# Patient Record
Sex: Female | Born: 1975 | Race: White | Hispanic: No | Marital: Married | Smoking: Former smoker
Health system: Southern US, Community
[De-identification: ages and names within clinical notes are randomized; demographics above are authoritative.]

## PROBLEM LIST (undated history)

## (undated) DIAGNOSIS — IMO0001 Reserved for inherently not codable concepts without codable children: Secondary | ICD-10-CM

## (undated) DIAGNOSIS — K5900 Constipation, unspecified: Secondary | ICD-10-CM

## (undated) DIAGNOSIS — N83209 Unspecified ovarian cyst, unspecified side: Secondary | ICD-10-CM

## (undated) DIAGNOSIS — G43909 Migraine, unspecified, not intractable, without status migrainosus: Secondary | ICD-10-CM

## (undated) DIAGNOSIS — F329 Major depressive disorder, single episode, unspecified: Secondary | ICD-10-CM

## (undated) DIAGNOSIS — R52 Pain, unspecified: Secondary | ICD-10-CM

## (undated) DIAGNOSIS — F32A Depression, unspecified: Secondary | ICD-10-CM

## (undated) DIAGNOSIS — G4700 Insomnia, unspecified: Secondary | ICD-10-CM

## (undated) DIAGNOSIS — F419 Anxiety disorder, unspecified: Secondary | ICD-10-CM

## (undated) DIAGNOSIS — M797 Fibromyalgia: Secondary | ICD-10-CM

## (undated) HISTORY — DX: Fibromyalgia: M79.7

## (undated) HISTORY — DX: Pain, unspecified: R52

## (undated) HISTORY — DX: Depression, unspecified: F32.A

## (undated) HISTORY — DX: Constipation, unspecified: K59.00

## (undated) HISTORY — DX: Insomnia, unspecified: G47.00

## (undated) HISTORY — PX: OTHER SURGICAL HISTORY: SHX169

## (undated) HISTORY — DX: Major depressive disorder, single episode, unspecified: F32.9

## (undated) HISTORY — PX: AUGMENTATION MAMMAPLASTY: SUR837

## (undated) HISTORY — DX: Anxiety disorder, unspecified: F41.9

## (undated) HISTORY — DX: Reserved for inherently not codable concepts without codable children: IMO0001

## (undated) HISTORY — DX: Unspecified ovarian cyst, unspecified side: N83.209

## (undated) HISTORY — DX: Migraine, unspecified, not intractable, without status migrainosus: G43.909

---

## 2005-09-07 ENCOUNTER — Ambulatory Visit: Payer: Self-pay | Admitting: Family Medicine

## 2006-09-23 ENCOUNTER — Observation Stay: Payer: Self-pay | Admitting: Obstetrics & Gynecology

## 2006-09-27 ENCOUNTER — Inpatient Hospital Stay: Payer: Self-pay | Admitting: Unknown Physician Specialty

## 2007-02-07 HISTORY — PX: PLACEMENT OF BREAST IMPLANTS: SHX6334

## 2007-03-12 ENCOUNTER — Ambulatory Visit: Payer: Self-pay | Admitting: Unknown Physician Specialty

## 2010-02-06 ENCOUNTER — Emergency Department: Payer: Self-pay | Admitting: Emergency Medicine

## 2011-11-15 ENCOUNTER — Ambulatory Visit: Payer: Self-pay

## 2012-12-26 ENCOUNTER — Ambulatory Visit: Payer: Self-pay | Admitting: Podiatry

## 2013-01-13 ENCOUNTER — Ambulatory Visit: Payer: Self-pay | Admitting: Podiatry

## 2014-07-30 ENCOUNTER — Telehealth: Payer: Self-pay | Admitting: Unknown Physician Specialty

## 2014-07-30 NOTE — Telephone Encounter (Signed)
I tried to call pt to reschedule appt for 08/14/14, called work # pt no longer works there, called home # left voicemail, also mailed a letter to inform pt appt has been cancelled. Thanks.

## 2014-08-14 ENCOUNTER — Ambulatory Visit: Payer: Self-pay | Admitting: Unknown Physician Specialty

## 2014-09-07 DIAGNOSIS — F319 Bipolar disorder, unspecified: Secondary | ICD-10-CM | POA: Insufficient documentation

## 2014-09-07 DIAGNOSIS — IMO0001 Reserved for inherently not codable concepts without codable children: Secondary | ICD-10-CM | POA: Insufficient documentation

## 2014-09-07 DIAGNOSIS — K5900 Constipation, unspecified: Secondary | ICD-10-CM | POA: Insufficient documentation

## 2014-09-07 DIAGNOSIS — G43909 Migraine, unspecified, not intractable, without status migrainosus: Secondary | ICD-10-CM | POA: Insufficient documentation

## 2014-09-07 DIAGNOSIS — F419 Anxiety disorder, unspecified: Secondary | ICD-10-CM | POA: Insufficient documentation

## 2014-09-07 DIAGNOSIS — M797 Fibromyalgia: Secondary | ICD-10-CM | POA: Insufficient documentation

## 2014-09-07 DIAGNOSIS — F329 Major depressive disorder, single episode, unspecified: Secondary | ICD-10-CM

## 2014-09-08 ENCOUNTER — Ambulatory Visit (INDEPENDENT_AMBULATORY_CARE_PROVIDER_SITE_OTHER): Payer: Medicaid Other | Admitting: Unknown Physician Specialty

## 2014-09-08 ENCOUNTER — Encounter: Payer: Self-pay | Admitting: Unknown Physician Specialty

## 2014-09-08 VITALS — BP 99/71 | HR 78 | Temp 98.7°F | Ht 67.0 in | Wt 115.0 lb

## 2014-09-08 DIAGNOSIS — G43909 Migraine, unspecified, not intractable, without status migrainosus: Secondary | ICD-10-CM | POA: Diagnosis not present

## 2014-09-08 DIAGNOSIS — M797 Fibromyalgia: Secondary | ICD-10-CM | POA: Diagnosis not present

## 2014-09-08 DIAGNOSIS — R300 Dysuria: Secondary | ICD-10-CM

## 2014-09-08 LAB — MICROSCOPIC EXAMINATION

## 2014-09-08 MED ORDER — TRAMADOL HCL 50 MG PO TABS: 50.0000 mg | ORAL_TABLET | Freq: Four times a day (QID) | ORAL | Status: DC | PRN

## 2014-09-08 MED ORDER — FLUOXETINE HCL 20 MG PO CAPS: 20.0000 mg | ORAL_CAPSULE | Freq: Every day | ORAL | Status: DC

## 2014-09-08 MED ORDER — CYCLOBENZAPRINE HCL 10 MG PO TABS: 10.0000 mg | ORAL_TABLET | Freq: Every day | ORAL | Status: DC

## 2014-09-08 MED ORDER — CLONAZEPAM 0.5 MG PO TABS: 0.5000 mg | ORAL_TABLET | Freq: Every day | ORAL | Status: DC | PRN

## 2014-09-08 MED ORDER — SULFAMETHOXAZOLE-TRIMETHOPRIM 800-160 MG PO TABS: 1.0000 | ORAL_TABLET | Freq: Two times a day (BID) | ORAL | Status: DC

## 2014-09-08 NOTE — Assessment & Plan Note (Signed)
Stable, continue present medications.   

## 2014-09-08 NOTE — Progress Notes (Signed)
BP 99/71 mmHg  Pulse 78  Temp(Src) 98.7 F (37.1 C)  Ht  (1.702 m)  Wt 115 lb (52.164 kg)  BMI 18.01 kg/m2  SpO2 99%  LMP 08/11/2014 (Exact Date)   Subjective:    Patient ID: Wanda Blanchard, female    DOB: 27-Mar-1975, 39 y.o.   MRN: [id number]  HPI: Wanda Blanchard is a 39 y.o. female  Chief Complaint  Patient presents with  . Fibromyalgia    med refills  . Migraine    med refills  . Urinary Tract Infection   1.  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   Patient requesting refill of pain medication.  Takes Tramadol about 1-2 days/week.  When t is really cold and the weather changes drastically Pain status:  stable  H6 Satisfied with current treatment?:  yes  H6 Medication side effects:  no P1  Medication compliance:  excellent compliance  P1 Previous pain specialty evaluation:  no H6 Non-narcotic analgesic meds :  yes  H6   Narcotic contract:  yes  H6  2.  MIGRAINES Patient is requesting refill(s). Getting them once a month around menstrual periods.  and wil take the occasional Tramadol during the day and Imitrex if at night Satisfied with current treatment?:  yes  H6 Medication side effects:  no P1  Medication compliance:  excellent compliance  P1 Migraine triggers:  not eating H8 Relief with abortive medication:  yes  H7 Aura:  no R10 Nausea:  yes   R6 Vomiting:  yes  R6 Photophobia:  yes  R2  Urinary Tract Infection  Chronicity: 1 week. The problem occurs intermittently. The problem has been gradually improving. The quality of the pain is described as burning and aching. The pain is moderate. There has been no fever. Associated symptoms include frequency. Pertinent negatives include no chills, discharge, hematuria, hesitancy, sweats or vomiting. She has tried home medications for the symptoms. The treatment provided moderate relief. Her past medical history is significant for recurrent UTIs.       Relevant past medical, surgical, family and social history reviewed and updated as indicated. Interim medical history since our last visit reviewed. Allergies and medications reviewed and updated.  Review of Systems  Constitutional: Negative for chills.  Gastrointestinal: Negative for vomiting.  Genitourinary: Positive for frequency. Negative for hesitancy and hematuria.    Per HPI unless specifically indicated above     Objective:    BP 99/71 mmHg  Pulse 78  Temp(Src) 98.7 F (37.1 C)  Ht  (1.702 m)  Wt 115 lb (52.164 kg)  BMI 18.01 kg/m2  SpO2 99%  LMP 08/11/2014 (Exact Date)  Wt Readings from Last 3 Encounters:  09/08/14 115 lb (52.164 kg)  03/16/14 117 lb (53.071 kg)    Physical Exam  Constitutional: She is oriented to person, place, and time. She appears well-developed and well-nourished. No distress.  HENT:  Head: Normocephalic and atraumatic.  Eyes: Conjunctivae and lids are normal. Right eye exhibits no discharge. Left eye exhibits no discharge. No scleral icterus.  Cardiovascular: Normal rate, regular rhythm and normal heart sounds.   Pulmonary/Chest: Effort normal and breath sounds normal. No respiratory distress.  Abdominal: Soft. Normal appearance and bowel sounds are normal. She exhibits no distension. There is no splenomegaly or hepatomegaly. There is no tenderness. There is no rebound and no guarding.  Musculoskeletal: Normal range of motion.  Neurological: She is alert and oriented to person,  place, and time.  Skin: Skin is intact. No rash noted. No pallor.  Psychiatric: She has a normal mood and affect. Her behavior is normal. Judgment and thought content normal.    No results found for this or any previous visit.    Assessment & Plan:   Problem List Items Addressed This Visit      Unprioritized   Migraine    Stable, continue present medications.       Relevant Medications   clonazePAM (KLONOPIN) 0.5 MG tablet   cyclobenzaprine  (FLEXERIL) 10 MG tablet   FLUoxetine (PROZAC) 20 MG capsule   traMADol (ULTRAM) 50 MG tablet   Fibromyalgia    Stable, continue present medications.        Other Visit Diagnoses    Dysuria    -  Primary    urine positive.  Rx for Septra    Relevant Orders    UA/M w/rflx Culture, Routine        Follow up plan: Return in about 6 months (around 03/11/2015) for physical.

## 2014-09-10 LAB — UA/M W/RFLX CULTURE, ROUTINE

## 2015-02-15 ENCOUNTER — Encounter: Payer: Self-pay | Admitting: Unknown Physician Specialty

## 2015-03-30 ENCOUNTER — Encounter: Payer: Self-pay | Admitting: Unknown Physician Specialty

## 2015-03-30 ENCOUNTER — Ambulatory Visit (INDEPENDENT_AMBULATORY_CARE_PROVIDER_SITE_OTHER): Payer: Medicaid Other | Admitting: Unknown Physician Specialty

## 2015-03-30 VITALS — BP 101/71 | HR 76 | Temp 99.0°F | Ht 66.8 in | Wt 112.4 lb

## 2015-03-30 DIAGNOSIS — F32A Depression, unspecified: Secondary | ICD-10-CM

## 2015-03-30 DIAGNOSIS — Z0000 Encounter for general adult medical examination without abnormal findings: Secondary | ICD-10-CM | POA: Diagnosis not present

## 2015-03-30 DIAGNOSIS — M797 Fibromyalgia: Secondary | ICD-10-CM | POA: Diagnosis not present

## 2015-03-30 DIAGNOSIS — F329 Major depressive disorder, single episode, unspecified: Secondary | ICD-10-CM | POA: Diagnosis not present

## 2015-03-30 DIAGNOSIS — E559 Vitamin D deficiency, unspecified: Secondary | ICD-10-CM | POA: Diagnosis not present

## 2015-03-30 DIAGNOSIS — G43009 Migraine without aura, not intractable, without status migrainosus: Secondary | ICD-10-CM

## 2015-03-30 MED ORDER — TRAMADOL HCL 50 MG PO TABS: 50.0000 mg | ORAL_TABLET | Freq: Four times a day (QID) | ORAL | Status: DC | PRN

## 2015-03-30 MED ORDER — CYCLOBENZAPRINE HCL 10 MG PO TABS: 10.0000 mg | ORAL_TABLET | Freq: Every day | ORAL | Status: DC

## 2015-03-30 MED ORDER — CLONAZEPAM 0.5 MG PO TABS: 0.5000 mg | ORAL_TABLET | Freq: Every day | ORAL | Status: DC | PRN

## 2015-03-30 MED ORDER — FLUOXETINE HCL 20 MG PO CAPS: 20.0000 mg | ORAL_CAPSULE | Freq: Every day | ORAL | Status: DC

## 2015-03-30 NOTE — Assessment & Plan Note (Signed)
Stable, continue present medications.   

## 2015-03-30 NOTE — Progress Notes (Signed)
BP 101/71 mmHg  Pulse 76  Temp(Src) 99 F (37.2 C)  Ht 5' 6.8" (1.697 m)  Wt 112 lb 6.4 oz (50.984 kg)  BMI 17.70 kg/m2  SpO2 98%  LMP 03/16/2015 (Exact Date)   Subjective:    Patient ID: Wanda Blanchard, female    DOB: 1975/02/13, 40 y.o.   MRN: [id number]  HPI: Wanda Blanchard is a 40 y.o. female  Chief Complaint  Patient presents with  . Annual Exam   1.  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   Patient requesting refill of pain medication.  Takes Tramadol about 1-2 days/week.  When t is really cold and the weather changes drastically Pain status:  stable  H6 Satisfied with current treatment?:  yes  H6 Medication side effects:  no P1  Medication compliance:  excellent compliance  P1 Previous pain specialty evaluation:  no H6 Non-narcotic analgesic meds :  yes  H6   Narcotic contract:  yes  H6  2.  Depression Doing well despite boyfriend dying.   Depression screen PHQ 2/9 03/30/2015  Decreased Interest 1  Down, Depressed, Hopeless 1  PHQ - 2 Score 2      3.  MIGRAINES Patient is requesting refill(s). Getting them once a month around menstrual periods.  and wil take the occasional Tramadol during the day and Imitrex if at night Satisfied with current treatment?:  yes  H6 Medication side effects:  no P1  Medication compliance:  excellent compliance  P1 Migraine triggers:  not eating H8 Relief with abortive medication:  yes  H7 Aura:  no R10 Nausea:  yes   R6 Vomiting:  yes  R6 Photophobia:  yes  R2   Past Medical History  Diagnosis Date  . Migraine   . Myalgia and myositis   . Constipation   . Depression   . Anxiety   . Insomnia   . Fibromyalgia   . Whole body pain   . Ruptured ovarian cyst    Past Surgical History  Procedure Laterality Date  . Cesarean section    . Placement of breast implants  2009   Family History  Problem Relation Age of Onset  .  Migraines Mother   . Cancer Father     prostate  . Heart disease Father   . Lung disease Father   . Stroke Paternal Aunt   . Diabetes Paternal Uncle   . Autism Son      Relevant past medical, surgical, family and social history reviewed and updated as indicated. Interim medical history since our last visit reviewed. Allergies and medications reviewed and updated.  Review of Systems  Per HPI unless specifically indicated above     Objective:    BP 101/71 mmHg  Pulse 76  Temp(Src) 99 F (37.2 C)  Ht 5' 6.8" (1.697 m)  Wt 112 lb 6.4 oz (50.984 kg)  BMI 17.70 kg/m2  SpO2 98%  LMP 03/16/2015 (Exact Date)  Wt Readings from Last 3 Encounters:  03/30/15 112 lb 6.4 oz (50.984 kg)  09/08/14 115 lb (52.164 kg)  03/16/14 117 lb (53.071 kg)    Physical Exam  Constitutional: She is oriented to person, place, and time. She appears well-developed and well-nourished.  HENT:  Head: Normocephalic and atraumatic.  Eyes: Pupils are equal, round, and reactive to light. Right eye exhibits no discharge. Left eye exhibits no discharge. No scleral icterus.  Neck: Normal range  of motion. Neck supple. Carotid bruit is not present. No thyromegaly present.  Cardiovascular: Normal rate, regular rhythm and normal heart sounds.  Exam reveals no gallop and no friction rub.   No murmur heard. Pulmonary/Chest: Effort normal and breath sounds normal. No respiratory distress. She has no wheezes. She has no rales. Right breast exhibits no inverted nipple, no mass, no nipple discharge, no skin change and no tenderness. Left breast exhibits no inverted nipple, no mass, no nipple discharge, no skin change and no tenderness. Breasts are symmetrical.  Bilateral implants  Abdominal: Soft. Bowel sounds are normal. There is no tenderness. There is no rebound.  Genitourinary: No breast swelling, tenderness or discharge.  Musculoskeletal: Normal range of motion.  Lymphadenopathy:    She has no cervical adenopathy.   Neurological: She is alert and oriented to person, place, and time.  Skin: Skin is warm, dry and intact. No rash noted.  Psychiatric: She has a normal mood and affect. Her speech is normal and behavior is normal. Judgment and thought content normal. Cognition and memory are normal.       Assessment & Plan:   Problem List Items Addressed This Visit      Unprioritized   Migraine    Stable, continue present medications.        Relevant Medications   clonazePAM (KLONOPIN) 0.5 MG tablet   cyclobenzaprine (FLEXERIL) 10 MG tablet   FLUoxetine (PROZAC) 20 MG capsule   traMADol (ULTRAM) 50 MG tablet   Depression    Stable, continue present medications.        Relevant Medications   FLUoxetine (PROZAC) 20 MG capsule   Fibromyalgia - Primary    Stable, continue present medications.        Relevant Orders   CBC with Differential/Platelet   Comprehensive metabolic panel   Lipid Panel w/o Chol/HDL Ratio   TSH   VITAMIN D 25 Hydroxy (Vit-D Deficiency, Fractures)   Vitamin D deficiency    Other Visit Diagnoses    Routine general medical examination at a health care facility        Relevant Orders    HIV antibody        Follow up plan: Return in about 6 months (around 09/27/2015).

## 2015-03-31 ENCOUNTER — Encounter: Payer: Self-pay | Admitting: Unknown Physician Specialty

## 2015-03-31 LAB — CBC WITH DIFFERENTIAL/PLATELET
BASOS ABS: 0 10*3/uL (ref 0.0–0.2)
Basos: 1 %
EOS (ABSOLUTE): 0.2 10*3/uL (ref 0.0–0.4)
Eos: 5 %
Hematocrit: 38.7 % (ref 34.0–46.6)
Hemoglobin: 12.8 g/dL (ref 11.1–15.9)
Immature Grans (Abs): 0 10*3/uL (ref 0.0–0.1)
Immature Granulocytes: 0 %
Lymphocytes Absolute: 0.9 10*3/uL (ref 0.7–3.1)
Lymphs: 22 %
MCH: 29.2 pg (ref 26.6–33.0)
MCHC: 33.1 g/dL (ref 31.5–35.7)
MCV: 88 fL (ref 79–97)
MONOS ABS: 0.4 10*3/uL (ref 0.1–0.9)
Monocytes: 10 %
NEUTROS ABS: 2.5 10*3/uL (ref 1.4–7.0)
Neutrophils: 62 %
Platelets: 211 10*3/uL (ref 150–379)
RBC: 4.38 x10E6/uL (ref 3.77–5.28)
RDW: 13.3 % (ref 12.3–15.4)
WBC: 4 10*3/uL (ref 3.4–10.8)

## 2015-03-31 LAB — COMPREHENSIVE METABOLIC PANEL
ALK PHOS: 68 IU/L (ref 39–117)
ALT: 7 IU/L (ref 0–32)
AST: 15 IU/L (ref 0–40)
Albumin/Globulin Ratio: 2.2 (ref 1.1–2.5)
Albumin: 4.3 g/dL (ref 3.5–5.5)
BUN/Creatinine Ratio: 9 (ref 8–20)
BUN: 6 mg/dL (ref 6–20)
Bilirubin Total: <0.2 mg/dL (ref 0.0–1.2)
CO2: 26 mmol/L (ref 18–29)
CREATININE: 0.66 mg/dL (ref 0.57–1.00)
Calcium: 9.1 mg/dL (ref 8.7–10.2)
Chloride: 101 mmol/L (ref 96–106)
GFR calc Af Amer: 131 mL/min/{1.73_m2} (ref >59)
GFR calc non Af Amer: 113 mL/min/{1.73_m2} (ref >59)
GLOBULIN, TOTAL: 2 g/dL (ref 1.5–4.5)
GLUCOSE: 95 mg/dL (ref 65–99)
Potassium: 4.4 mmol/L (ref 3.5–5.2)
SODIUM: 140 mmol/L (ref 134–144)
Total Protein: 6.3 g/dL (ref 6.0–8.5)

## 2015-03-31 LAB — LIPID PANEL W/O CHOL/HDL RATIO
CHOLESTEROL TOTAL: 141 mg/dL (ref 100–199)
HDL: 73 mg/dL (ref >39)
LDL CALC: 53 mg/dL (ref 0–99)
TRIGLYCERIDES: 74 mg/dL (ref 0–149)
VLDL Cholesterol Cal: 15 mg/dL (ref 5–40)

## 2015-03-31 LAB — VITAMIN D 25 HYDROXY (VIT D DEFICIENCY, FRACTURES): Vit D, 25-Hydroxy: 26.8 ng/mL — ABNORMAL LOW (ref 30.0–100.0)

## 2015-03-31 LAB — HIV ANTIBODY (ROUTINE TESTING W REFLEX): HIV Screen 4th Generation wRfx: NONREACTIVE

## 2015-03-31 LAB — TSH: TSH: 3.11 u[IU]/mL (ref 0.450–4.500)

## 2015-09-27 ENCOUNTER — Encounter: Payer: Self-pay | Admitting: Unknown Physician Specialty

## 2015-09-27 ENCOUNTER — Ambulatory Visit (INDEPENDENT_AMBULATORY_CARE_PROVIDER_SITE_OTHER): Payer: Medicaid Other | Admitting: Unknown Physician Specialty

## 2015-09-27 VITALS — BP 104/69 | HR 71 | Temp 99.1°F | Ht 67.3 in | Wt 112.8 lb

## 2015-09-27 DIAGNOSIS — M797 Fibromyalgia: Secondary | ICD-10-CM | POA: Diagnosis not present

## 2015-09-27 DIAGNOSIS — H9192 Unspecified hearing loss, left ear: Secondary | ICD-10-CM | POA: Diagnosis not present

## 2015-09-27 DIAGNOSIS — F329 Major depressive disorder, single episode, unspecified: Secondary | ICD-10-CM | POA: Diagnosis not present

## 2015-09-27 DIAGNOSIS — F419 Anxiety disorder, unspecified: Secondary | ICD-10-CM | POA: Diagnosis not present

## 2015-09-27 DIAGNOSIS — F32A Depression, unspecified: Secondary | ICD-10-CM

## 2015-09-27 MED ORDER — CLONAZEPAM 0.5 MG PO TABS: 0.5000 mg | ORAL_TABLET | Freq: Every day | ORAL | 1 refills | Status: DC | PRN

## 2015-09-27 MED ORDER — TRAMADOL HCL 50 MG PO TABS: 50.0000 mg | ORAL_TABLET | Freq: Four times a day (QID) | ORAL | 2 refills | Status: DC | PRN

## 2015-09-27 NOTE — Assessment & Plan Note (Signed)
Stable, continue present medications.   

## 2015-09-27 NOTE — Assessment & Plan Note (Addendum)
Stable, continue present medications. Referral to chiropractic care

## 2015-09-27 NOTE — Progress Notes (Signed)
BP 104/69 (BP Location: Left Arm, Patient Position: Sitting, Cuff Size: Small)   Pulse 71   Temp 99.1 F (37.3 C)   Ht 5' 7.3" (1.709 m)   Wt 112 lb 12.8 oz (51.2 kg)   LMP 09/24/2015 (Exact Date)   SpO2 98%   BMI 17.51 kg/m    Subjective:    Patient ID: Wanda Blanchard, female    DOB: 07/31/1975, 40 y.o.   MRN: [id number]  HPI: Wanda Blanchard is a 40 y.o. female  Chief Complaint  Patient presents with  . Depression  . Ear Problem    pt states she can feel in her ear and states it feels moist sometimes  . Pain    pt states she would [facility] [place], states hip joints really bother her    1.  FIBROMYALGIA Having some hip pain lately and interested in seeing a chiropractor.  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   Patient requesting refill of pain medication.  Takes Tramadol about 1-2 days/week.  When t is really cold and the weather changes drastically Pain status:  stable  H6 Satisfied with current treatment?:  yes  H6 Medication side effects:  no P1  Medication compliance:  excellent compliance  P1 Previous pain specialty evaluation:  no H6 Non-narcotic analgesic meds :  yes  H6   Narcotic contract:  yes  H6  Depression Pt states she is doing better but often feels "really overwhelmed."  Takes Clonazepam 2-3 times/week [facility] 09/27/2015 03/30/2015  Decreased Interest 2 1  Down, Depressed, Hopeless 1 1  PHQ - 2 Score 3 2  Altered sleeping 2 -  Tired, decreased energy 1 -  Change in appetite 2 -  Feeling bad or failure about yourself  1 -  Trouble concentrating 1 -  Moving slowly or fidgety/restless 0 -  Suicidal thoughts 0 -  PHQ-9 Score 10 -     Relevant past medical, surgical, family and social history reviewed and updated as indicated. Interim medical history since our last visit reviewed. Allergies and medications reviewed and  updated.  Review of Systems  HENT: Positive for ear discharge and hearing loss.     Per HPI unless specifically indicated above     Objective:    BP 104/69 (BP Location: Left Arm, Patient Position: Sitting, Cuff Size: Small)   Pulse 71   Temp 99.1 F (37.3 C)   Ht 5' 7.3" (1.709 m)   Wt 112 lb 12.8 oz (51.2 kg)   LMP 09/24/2015 (Exact Date)   SpO2 98%   BMI 17.51 kg/m   Wt Readings from Last 3 Encounters:  09/27/15 112 lb 12.8 oz (51.2 kg)  03/30/15 112 lb 6.4 oz (51 kg)  09/08/14 115 lb (52.2 kg)    Physical Exam  Constitutional: She is oriented to person, place, and time. She appears well-developed and well-nourished. No distress.  HENT:  Head: Normocephalic and atraumatic.  Right Ear: Tympanic membrane, external ear and ear canal normal.  Left Ear: Tympanic membrane, external ear and ear canal normal.  Eyes: Conjunctivae and lids are normal. Right eye exhibits no discharge. Left eye exhibits no discharge. No scleral icterus.  Neck: Normal range of motion. Neck supple. No JVD present. Carotid bruit is not present.  Cardiovascular: Normal rate, regular rhythm and normal heart sounds.   Pulmonary/Chest: Effort normal and breath sounds normal.  Abdominal: Normal appearance. There is no splenomegaly or hepatomegaly.  Musculoskeletal: Normal range of motion.  Neurological: She is alert and oriented to person, place, and time.  Skin: Skin is warm, dry and intact. No rash noted. No pallor.  Psychiatric: She has a normal mood and affect. Her behavior is normal. Judgment and thought content normal.    Results for orders placed or performed in visit on 03/30/15  HIV antibody  Result Value Ref Range   HIV Screen 4th Generation wRfx Non Reactive Non Reactive  CBC with Differential/Platelet  Result Value Ref Range   WBC 4.0 3.4 - 10.8 x10E3/uL   RBC 4.38 3.77 - 5.28 x10E6/uL   Hemoglobin 12.8 11.1 - 15.9 g/dL   Hematocrit 16.138.7 09.634.0 - 46.6 %   MCV 88 79 - 97 fL   MCH 29.2  26.6 - 33.0 pg   MCHC 33.1 31.5 - 35.7 g/dL   RDW 04.513.3 40.912.3 - 81.115.4 %   Platelets 211 150 - 379 x10E3/uL   Neutrophils 62 %   Lymphs 22 %   Monocytes 10 %   Eos 5 %   Basos 1 %   Neutrophils Absolute 2.5 1.4 - 7.0 x10E3/uL   Lymphocytes Absolute 0.9 0.7 - 3.1 x10E3/uL   Monocytes Absolute 0.4 0.1 - 0.9 x10E3/uL   EOS (ABSOLUTE) 0.2 0.0 - 0.4 x10E3/uL   Basophils Absolute 0.0 0.0 - 0.2 x10E3/uL   Immature Granulocytes 0 %   Immature Grans (Abs) 0.0 0.0 - 0.1 x10E3/uL  Comprehensive metabolic panel  Result Value Ref Range   Glucose 95 65 - 99 mg/dL   BUN 6 6 - 20 mg/dL   Creatinine, Ser 9.140.66 0.57 - 1.00 mg/dL   GFR calc non Af Amer 113 >59 mL/min/1.73   GFR calc Af Amer 131 >59 mL/min/1.73   BUN/Creatinine Ratio 9 8 - 20   Sodium 140 134 - 144 mmol/L   Potassium 4.4 3.5 - 5.2 mmol/L   Chloride 101 96 - 106 mmol/L   CO2 26 18 - 29 mmol/L   Calcium 9.1 8.7 - 10.2 mg/dL   Total Protein 6.3 6.0 - 8.5 g/dL   Albumin 4.3 3.5 - 5.5 g/dL   Globulin, Total 2.0 1.5 - 4.5 g/dL   Albumin/Globulin Ratio 2.2 1.1 - 2.5   Bilirubin Total <0.2 0.0 - 1.2 mg/dL   Alkaline Phosphatase 68 39 - 117 IU/L   AST 15 0 - 40 IU/L   ALT 7 0 - 32 IU/L  Lipid Panel w/o Chol/HDL Ratio  Result Value Ref Range   Cholesterol, Total 141 100 - 199 mg/dL   Triglycerides 74 0 - 149 mg/dL   HDL 73 >78>39 mg/dL   VLDL Cholesterol Cal 15 5 - 40 mg/dL   LDL Calculated 53 0 - 99 mg/dL  TSH  Result Value Ref Range   TSH 3.110 0.450 - 4.500 uIU/mL  VITAMIN D 25 Hydroxy (Vit-D Deficiency, Fractures)  Result Value Ref Range   Vit D, 25-Hydroxy 26.8 (L) 30.0 - 100.0 ng/mL      Assessment & Plan:   Problem List Items Addressed This Visit      Unprioritized   Anxiety    Stable, continue present medications.        Depression    Stable, continue present medications.        Fibromyalgia - Primary    Stable, continue present medications. Referral to chiropractic care       Relevant Orders   Ambulatory  referral  to Chiropractic    Other Visit Diagnoses    Hearing decreased, left       Discussed OTC Flonase       Follow up plan: Return in about 6 months (around 03/29/2016).

## 2015-09-28 ENCOUNTER — Ambulatory Visit: Payer: Medicaid Other | Admitting: Unknown Physician Specialty

## 2016-04-03 ENCOUNTER — Ambulatory Visit (INDEPENDENT_AMBULATORY_CARE_PROVIDER_SITE_OTHER): Payer: Medicaid Other | Admitting: Unknown Physician Specialty

## 2016-04-03 ENCOUNTER — Encounter: Payer: Self-pay | Admitting: Unknown Physician Specialty

## 2016-04-03 VITALS — BP 109/74 | HR 75 | Temp 98.3°F | Ht 66.8 in | Wt 115.5 lb

## 2016-04-03 DIAGNOSIS — M797 Fibromyalgia: Secondary | ICD-10-CM | POA: Diagnosis not present

## 2016-04-03 DIAGNOSIS — F32A Depression, unspecified: Secondary | ICD-10-CM

## 2016-04-03 DIAGNOSIS — Z0000 Encounter for general adult medical examination without abnormal findings: Secondary | ICD-10-CM

## 2016-04-03 DIAGNOSIS — F329 Major depressive disorder, single episode, unspecified: Secondary | ICD-10-CM | POA: Diagnosis not present

## 2016-04-03 NOTE — Progress Notes (Signed)
BP 109/74 (BP Location: Left Arm, Patient Position: Sitting, Cuff Size: Small)   Pulse 75   Temp 98.3 F (36.8 C)   Ht 5' 6.8" (1.697 m)   Wt 115 lb 8 oz (52.4 kg)   LMP 03/25/2016 (Exact Date)   SpO2 98%   BMI 18.20 kg/m    Subjective:    Patient ID: Wanda Blanchard, female    DOB: [birth year], 41 y.o.   MRN: [id number]  HPI: Wanda Blanchard is a 41 y.o. female  Chief Complaint  Patient presents with  . Annual Exam    pt states there is a skin tag on the right side of her vagina, she wants to talk about options and what she can do for it  . Menstrual Problem    pt states her period has been coming earlier than normal   Depression screen [facility] 2/9 04/03/2016 09/27/2015 03/30/2015  Decreased Interest 0 2 1  Down, Depressed, Hopeless 0 1 1  PHQ - 2 Score 0 3 2  Altered sleeping 2 2 -  Tired, decreased energy 1 1 -  Change in appetite 0 2 -  Feeling bad or failure about yourself  0 1 -  Trouble concentrating 0 1 -  Moving slowly or fidgety/restless 0 0 -  Suicidal thoughts 0 0 -  PHQ-9 Score 3 10 -   Family History  Problem Relation Age of Onset  . Migraines Mother   . Cancer Father     prostate  . Heart disease Father   . Lung disease Father   . Autism Son   . Stroke Paternal Aunt   . Diabetes Paternal Uncle    Social History   Social History  . Marital status: Divorced    Spouse name: N/A  . Number of children: N/A  . Years of education: N/A   Occupational History  . Not on file.   Social History Main Topics  . Smoking status: Former Smoker    Types: Cigarettes    Quit date: 04/21/2014  . Smokeless tobacco: Never Used  . Alcohol use 0.0 oz/week     Comment: pt states every now and then  . Drug use: No  . Sexual activity: Yes   Other Topics Concern  . Not on file   Social History Narrative  . No narrative on file   Past Medical History:  Diagnosis Date  . Anxiety   . Constipation   . Depression   . Fibromyalgia   . Insomnia   . Migraine   .  Myalgia and myositis   . Ruptured ovarian cyst   . Whole body pain    Past Surgical History:  Procedure Laterality Date  . CESAREAN SECTION    . PLACEMENT OF BREAST IMPLANTS  2009   Fibromyalgia This is doing a lot better.  Bought a new mattress which she thinks helps.    Depression Stable Depression screen [facility] 2/9 04/03/2016 09/27/2015 03/30/2015  Decreased Interest 0 2 1  Down, Depressed, Hopeless 0 1 1  PHQ - 2 Score 0 3 2  Altered sleeping 2 2 -  Tired, decreased energy 1 1 -  Change in appetite 0 2 -  Feeling bad or failure about yourself  0 1 -  Trouble concentrating 0 1 -  Moving slowly or fidgety/restless 0 0 -  Suicidal thoughts 0 0 -  PHQ-9 Score 3 10 -   Labial skin tag Finds it irritating and wants to consider removing it.  Relevant past medical, surgical, family and social history reviewed and updated as indicated. Interim medical history since our last visit reviewed. Allergies and medications reviewed and updated.  Review of Systems  Genitourinary:       Menstrual cycles every 21 days  All other systems reviewed and are negative.   Per HPI unless specifically indicated above     Objective:    BP 109/74 (BP Location: Left Arm, Patient Position: Sitting, Cuff Size: Small)   Pulse 75   Temp 98.3 F (36.8 C)   Ht 5' 6.8" (1.697 m)   Wt 115 lb 8 oz (52.4 kg)   LMP 03/25/2016 (Exact Date)   SpO2 98%   BMI 18.20 kg/m   Wt Readings from Last 3 Encounters:  04/03/16 115 lb 8 oz (52.4 kg)  09/27/15 112 lb 12.8 oz (51.2 kg)  03/30/15 112 lb 6.4 oz (51 kg)    Physical Exam  Constitutional: She is oriented to person, place, and time. She appears well-developed and well-nourished. No distress.  HENT:  Head: Normocephalic and atraumatic.  Eyes: Conjunctivae and lids are normal. Right eye exhibits no discharge. Left eye exhibits no discharge. No scleral icterus.  Neck: Normal range of motion. Neck supple. No JVD present. Carotid bruit is not present.    Cardiovascular: Normal rate, regular rhythm and normal heart sounds.   Pulmonary/Chest: Effort normal and breath sounds normal.  Abdominal: Normal appearance. There is no splenomegaly or hepatomegaly.  Musculoskeletal: Normal range of motion.  Neurological: She is alert and oriented to person, place, and time.  Skin: Skin is warm, dry and intact. No rash noted. No pallor.  Psychiatric: She has a normal mood and affect. Her behavior is normal. Judgment and thought content normal.      Assessment & Plan:   Problem List Items Addressed This Visit      Unprioritized   Depression    Stable, continue present medications.        Fibromyalgia    Stable, continue present medications.         Other Visit Diagnoses    Routine general medical examination at a health care facility    -  Primary   Relevant Orders   CBC with Differential/Platelet   Comprehensive metabolic panel   Lipid Panel w/o Chol/HDL Ratio   TSH   VITAMIN D 25 Hydroxy (Vit-D Deficiency, Fractures)       Follow up plan: Return for Schedule skin tag removal.

## 2016-04-03 NOTE — Assessment & Plan Note (Signed)
Stable, continue present medications.   

## 2016-04-04 ENCOUNTER — Encounter: Payer: Self-pay | Admitting: Unknown Physician Specialty

## 2016-04-04 LAB — COMPREHENSIVE METABOLIC PANEL
ALK PHOS: 57 IU/L (ref 39–117)
ALT: 12 IU/L (ref 0–32)
AST: 15 IU/L (ref 0–40)
Albumin/Globulin Ratio: 2 (ref 1.2–2.2)
Albumin: 4.3 g/dL (ref 3.5–5.5)
BUN/Creatinine Ratio: 13 (ref 9–23)
BUN: 10 mg/dL (ref 6–20)
Bilirubin Total: 0.2 mg/dL (ref 0.0–1.2)
CO2: 25 mmol/L (ref 18–29)
CREATININE: 0.75 mg/dL (ref 0.57–1.00)
Calcium: 8.8 mg/dL (ref 8.7–10.2)
Chloride: 102 mmol/L (ref 96–106)
GFR calc Af Amer: 117 mL/min/{1.73_m2} (ref >59)
GFR calc non Af Amer: 101 mL/min/{1.73_m2} (ref >59)
GLUCOSE: 92 mg/dL (ref 65–99)
Globulin, Total: 2.1 g/dL (ref 1.5–4.5)
Potassium: 4.5 mmol/L (ref 3.5–5.2)
SODIUM: 142 mmol/L (ref 134–144)
Total Protein: 6.4 g/dL (ref 6.0–8.5)

## 2016-04-04 LAB — CBC WITH DIFFERENTIAL/PLATELET
Basophils Absolute: 0 10*3/uL (ref 0.0–0.2)
Basos: 1 %
EOS (ABSOLUTE): 0 10*3/uL (ref 0.0–0.4)
EOS: 1 %
HEMATOCRIT: 38.8 % (ref 34.0–46.6)
Hemoglobin: 12.5 g/dL (ref 11.1–15.9)
IMMATURE GRANS (ABS): 0 10*3/uL (ref 0.0–0.1)
IMMATURE GRANULOCYTES: 0 %
LYMPHS: 25 %
Lymphocytes Absolute: 1.1 10*3/uL (ref 0.7–3.1)
MCH: 29.6 pg (ref 26.6–33.0)
MCHC: 32.2 g/dL (ref 31.5–35.7)
MCV: 92 fL (ref 79–97)
MONOS ABS: 0.4 10*3/uL (ref 0.1–0.9)
Monocytes: 9 %
Neutrophils Absolute: 2.8 10*3/uL (ref 1.4–7.0)
Neutrophils: 64 %
PLATELETS: 251 10*3/uL (ref 150–379)
RBC: 4.23 x10E6/uL (ref 3.77–5.28)
RDW: 13.9 % (ref 12.3–15.4)
WBC: 4.3 10*3/uL (ref 3.4–10.8)

## 2016-04-04 LAB — LIPID PANEL W/O CHOL/HDL RATIO
CHOLESTEROL TOTAL: 167 mg/dL (ref 100–199)
HDL: 87 mg/dL (ref >39)
LDL CALC: 68 mg/dL (ref 0–99)
TRIGLYCERIDES: 58 mg/dL (ref 0–149)
VLDL Cholesterol Cal: 12 mg/dL (ref 5–40)

## 2016-04-04 LAB — TSH: TSH: 1.55 u[IU]/mL (ref 0.450–4.500)

## 2016-04-04 LAB — VITAMIN D 25 HYDROXY (VIT D DEFICIENCY, FRACTURES): Vit D, 25-Hydroxy: 29.3 ng/mL — ABNORMAL LOW (ref 30.0–100.0)

## 2016-04-11 ENCOUNTER — Encounter: Payer: Self-pay | Admitting: Unknown Physician Specialty

## 2016-04-11 ENCOUNTER — Ambulatory Visit (INDEPENDENT_AMBULATORY_CARE_PROVIDER_SITE_OTHER): Payer: Medicaid Other | Admitting: Unknown Physician Specialty

## 2016-04-11 VITALS — BP 116/71 | HR 74 | Temp 98.0°F | Wt 120.6 lb

## 2016-04-11 DIAGNOSIS — L989 Disorder of the skin and subcutaneous tissue, unspecified: Secondary | ICD-10-CM | POA: Diagnosis not present

## 2016-04-11 NOTE — Progress Notes (Signed)
BP 116/71 (BP Location: Left Arm, Patient Position: Sitting, Cuff Size: Normal)   Pulse 74   Temp 98 F (36.7 C)   Wt 120 lb 9.6 oz (54.7 kg)   LMP 03/25/2016 (Exact Date)   SpO2 99%   BMI 19.00 kg/m    Subjective:    Patient ID: Wanda Blanchard, female    DOB: 11/27/1975, 41 y.o.   MRN: [id number]  HPI: Wanda Bashnna M Blanchard is a 41 y.o. female  Chief Complaint  Patient presents with  . Skin Tag removal   Skin tag on vagina that catches on clothes and therefore painful  Relevant past medical, surgical, family and social history reviewed and updated as indicated. Interim medical history since our last visit reviewed. Allergies and medications reviewed and updated.  Review of Systems  Per HPI unless specifically indicated above     Objective:    BP 116/71 (BP Location: Left Arm, Patient Position: Sitting, Cuff Size: Normal)   Pulse 74   Temp 98 F (36.7 C)   Wt 120 lb 9.6 oz (54.7 kg)   LMP 03/25/2016 (Exact Date)   SpO2 99%   BMI 19.00 kg/m   Wt Readings from Last 3 Encounters:  04/11/16 120 lb 9.6 oz (54.7 kg)  04/03/16 115 lb 8 oz (52.4 kg)  09/27/15 112 lb 12.8 oz (51.2 kg)    Physical Exam  Constitutional: She is oriented to person, place, and time. She appears well-developed and well-nourished. No distress.  HENT:  Head: Normocephalic and atraumatic.  Eyes: Conjunctivae and lids are normal. Right eye exhibits no discharge. Left eye exhibits no discharge. No scleral icterus.  Neck: Normal range of motion. Neck supple. No JVD present. Carotid bruit is not present.  Cardiovascular: Normal rate, regular rhythm and normal heart sounds.   Pulmonary/Chest: Effort normal and breath sounds normal.  Abdominal: Normal appearance. There is no splenomegaly or hepatomegaly.  Musculoskeletal: Normal range of motion.  Neurological: She is alert and oriented to person, place, and time.  Skin: Skin is warm, dry and intact. No rash noted. No pallor.  Skin tag on vagina    Psychiatric: She has a normal mood and affect. Her behavior is normal. Judgment and thought content normal.    Results for orders placed or performed in visit on 04/03/16  CBC with Differential/Platelet  Result Value Ref Range   WBC 4.3 3.4 - 10.8 x10E3/uL   RBC 4.23 3.77 - 5.28 x10E6/uL   Hemoglobin 12.5 11.1 - 15.9 g/dL   Hematocrit 08.638.8 57.834.0 - 46.6 %   MCV 92 79 - 97 fL   MCH 29.6 26.6 - 33.0 pg   MCHC 32.2 31.5 - 35.7 g/dL   RDW 46.913.9 62.912.3 - 52.815.4 %   Platelets 251 150 - 379 x10E3/uL   Neutrophils 64 Not Estab. %   Lymphs 25 Not Estab. %   Monocytes 9 Not Estab. %   Eos 1 Not Estab. %   Basos 1 Not Estab. %   Neutrophils Absolute 2.8 1.4 - 7.0 x10E3/uL   Lymphocytes Absolute 1.1 0.7 - 3.1 x10E3/uL   Monocytes Absolute 0.4 0.1 - 0.9 x10E3/uL   EOS (ABSOLUTE) 0.0 0.0 - 0.4 x10E3/uL   Basophils Absolute 0.0 0.0 - 0.2 x10E3/uL   Immature Granulocytes 0 Not Estab. %   Immature Grans (Abs) 0.0 0.0 - 0.1 x10E3/uL  Comprehensive metabolic panel  Result Value Ref Range   Glucose 92 65 - 99 mg/dL   BUN 10 6 - 20 mg/dL  Creatinine, Ser 0.75 0.57 - 1.00 mg/dL   GFR calc non Af Amer 101 >59 mL/min/1.73   GFR calc Af Amer 117 >59 mL/min/1.73   BUN/Creatinine Ratio 13 9 - 23   Sodium 142 134 - 144 mmol/L   Potassium 4.5 3.5 - 5.2 mmol/L   Chloride 102 96 - 106 mmol/L   CO2 25 18 - 29 mmol/L   Calcium 8.8 8.7 - 10.2 mg/dL   Total Protein 6.4 6.0 - 8.5 g/dL   Albumin 4.3 3.5 - 5.5 g/dL   Globulin, Total 2.1 1.5 - 4.5 g/dL   Albumin/Globulin Ratio 2.0 1.2 - 2.2   Bilirubin Total 0.2 0.0 - 1.2 mg/dL   Alkaline Phosphatase 57 39 - 117 IU/L   AST 15 0 - 40 IU/L   ALT 12 0 - 32 IU/L  Lipid Panel w/o Chol/HDL Ratio  Result Value Ref Range   Cholesterol, Total 167 100 - 199 mg/dL   Triglycerides 58 0 - 149 mg/dL   HDL 87 >40 mg/dL   VLDL Cholesterol Cal 12 5 - 40 mg/dL   LDL Calculated 68 0 - 99 mg/dL  TSH  Result Value Ref Range   TSH 1.550 0.450 - 4.500 uIU/mL  VITAMIN D 25  Hydroxy (Vit-D Deficiency, Fractures)  Result Value Ref Range   Vit D, 25-Hydroxy 29.3 (L) 30.0 - 100.0 ng/mL      Assessment & Plan:   Problem List Items Addressed This Visit    None    Visit Diagnoses    Skin lesion    -  Primary   After sterile prep, small amount of Lidocaine injected in the base of skin tag.  Skin tag shave off       Follow up plan: No Follow-up on file.

## 2016-07-10 ENCOUNTER — Ambulatory Visit
Admission: RE | Admit: 2016-07-10 | Discharge: 2016-07-10 | Disposition: A | Discharge status: Home / Self Care | Payer: Medicaid Other | Source: Ambulatory Visit | Attending: Unknown Physician Specialty | Admitting: Unknown Physician Specialty

## 2016-07-10 ENCOUNTER — Ambulatory Visit (INDEPENDENT_AMBULATORY_CARE_PROVIDER_SITE_OTHER): Payer: Medicaid Other | Admitting: Unknown Physician Specialty

## 2016-07-10 ENCOUNTER — Encounter: Payer: Self-pay | Admitting: Unknown Physician Specialty

## 2016-07-10 VITALS — BP 106/66 | HR 94 | Temp 98.5°F | Wt 117.0 lb

## 2016-07-10 DIAGNOSIS — M79675 Pain in left toe(s): Secondary | ICD-10-CM | POA: Insufficient documentation

## 2016-07-10 DIAGNOSIS — N6310 Unspecified lump in the right breast, unspecified quadrant: Principal | ICD-10-CM

## 2016-07-10 DIAGNOSIS — F419 Anxiety disorder, unspecified: Secondary | ICD-10-CM

## 2016-07-10 DIAGNOSIS — N6311 Unspecified lump in the right breast, upper outer quadrant: Secondary | ICD-10-CM | POA: Diagnosis not present

## 2016-07-10 DIAGNOSIS — N6315 Unspecified lump in the right breast, overlapping quadrants: Secondary | ICD-10-CM

## 2016-07-10 DIAGNOSIS — F329 Major depressive disorder, single episode, unspecified: Secondary | ICD-10-CM

## 2016-07-10 DIAGNOSIS — F32A Depression, unspecified: Secondary | ICD-10-CM

## 2016-07-10 MED ORDER — CLONAZEPAM 0.5 MG PO TABS: 0.5000 mg | ORAL_TABLET | Freq: Every day | ORAL | 1 refills | Status: DC | PRN

## 2016-07-10 MED ORDER — FLUOXETINE HCL 20 MG PO CAPS: 20.0000 mg | ORAL_CAPSULE | Freq: Every day | ORAL | 3 refills | Status: DC

## 2016-07-10 MED ORDER — METHYLPREDNISOLONE 4 MG PO TBPK: ORAL_TABLET | ORAL | 0 refills | Status: DC

## 2016-07-10 NOTE — Assessment & Plan Note (Signed)
Stable, continue present medications.   

## 2016-07-10 NOTE — Progress Notes (Signed)
BP 106/66   Pulse 94   Temp 98.5 F (36.9 C)   Wt 117 lb (53.1 kg)   SpO2 100%   BMI 18.43 kg/m    Subjective:    Patient ID: Wanda Blanchard, female    DOB: [birth year], 41 y.o.   MRN: [id number]  HPI: Wanda Blanchard is a 41 y.o. female  Chief Complaint  Patient presents with  . Cyst    pt states that she noticed a knot in her right breast about 7 to 10 days ago. States it is sore.  . Foot Pain    pt states that she has been having pain in her left foot, states it has recently gotten worse. States the pain in on the inside of her foot  . Medication Refill    klonopin and fluoxetine    Breast mass Noted right lateral breast mass about 1 week to 10 days ago.  Menses due to start tomorrow.  States the lump is not fixed and not tender.    Foot pain Pt with left foot pain.  States her left MTP of large toe is tender.  It hurts all the time and worse when walking on it.  It hurts to flex and extend.     Depression/anxiety Pt is here to refill her Clonazepam and Fluoxetine.  States she takes the Clonazepam about twice a week.  Nerves are "torn up" at this time due to life circumstances.    [facility] 07/10/2016 04/03/2016 09/27/2015 03/30/2015  Decreased Interest 1 0 2 1  Down, Depressed, Hopeless 1 0 1 1  PHQ - 2 Score 2 0 3 2  Altered sleeping 2 2 2  -  Tired, decreased energy 2 1 1  -  Change in appetite 2 0 2 -  Feeling bad or failure about yourself  0 0 1 -  Trouble concentrating 1 0 1 -  Moving slowly or fidgety/restless 0 0 0 -  Suicidal thoughts 0 0 0 -  PHQ-9 Score 9 3 10  -     Relevant past medical, surgical, family and social history reviewed and updated as indicated. Interim medical history since our last visit reviewed. Allergies and medications reviewed and updated.  Review of Systems  Per HPI unless specifically indicated above     Objective:    BP 106/66   Pulse 94   Temp 98.5 F (36.9 C)   Wt 117 lb (53.1 kg)   SpO2 100%   BMI 18.43  kg/m   Wt Readings from Last 3 Encounters:  07/10/16 117 lb (53.1 kg)  04/11/16 120 lb 9.6 oz (54.7 kg)  04/03/16 115 lb 8 oz (52.4 kg)    Physical Exam  Constitutional: She is oriented to person, place, and time. She appears well-developed and well-nourished. No distress.  HENT:  Head: Normocephalic and atraumatic.  Eyes: Conjunctivae and lids are normal. Right eye exhibits no discharge. Left eye exhibits no discharge. No scleral icterus.  Neck: Normal range of motion. Neck supple. No JVD present. Carotid bruit is not present.  Cardiovascular: Normal rate, regular rhythm and normal heart sounds.   Pulmonary/Chest: Effort normal and breath sounds normal. She exhibits mass and tenderness.    Implants.  More of area of tenderness than mass  Abdominal: Normal appearance. There is no splenomegaly or hepatomegaly.  Musculoskeletal: Normal range of motion.  Left large MTP tender with swelling.  No erythema  Neurological: She is alert and oriented to person, place, and time.  Skin: Skin is warm, dry and intact. No rash noted. No pallor.  Psychiatric: She has a normal mood and affect. Her behavior is normal. Judgment and thought content normal.    Results for orders placed or performed in visit on 04/03/16  CBC with Differential/Platelet  Result Value Ref Range   WBC 4.3 3.4 - 10.8 x10E3/uL   RBC 4.23 3.77 - 5.28 x10E6/uL   Hemoglobin 12.5 11.1 - 15.9 g/dL   Hematocrit 45.4 09.8 - 46.6 %   MCV 92 79 - 97 fL   MCH 29.6 26.6 - 33.0 pg   MCHC 32.2 31.5 - 35.7 g/dL   RDW 11.9 14.7 - 82.9 %   Platelets 251 150 - 379 x10E3/uL   Neutrophils 64 Not Estab. %   Lymphs 25 Not Estab. %   Monocytes 9 Not Estab. %   Eos 1 Not Estab. %   Basos 1 Not Estab. %   Neutrophils Absolute 2.8 1.4 - 7.0 x10E3/uL   Lymphocytes Absolute 1.1 0.7 - 3.1 x10E3/uL   Monocytes Absolute 0.4 0.1 - 0.9 x10E3/uL   EOS (ABSOLUTE) 0.0 0.0 - 0.4 x10E3/uL   Basophils Absolute 0.0 0.0 - 0.2 x10E3/uL   Immature  Granulocytes 0 Not Estab. %   Immature Grans (Abs) 0.0 0.0 - 0.1 x10E3/uL  Comprehensive metabolic panel  Result Value Ref Range   Glucose 92 65 - 99 mg/dL   BUN 10 6 - 20 mg/dL   Creatinine, Ser 5.62 0.57 - 1.00 mg/dL   GFR calc non Af Amer 101 >59 mL/min/1.73   GFR calc Af Amer 117 >59 mL/min/1.73   BUN/Creatinine Ratio 13 9 - 23   Sodium 142 134 - 144 mmol/L   Potassium 4.5 3.5 - 5.2 mmol/L   Chloride 102 96 - 106 mmol/L   CO2 25 18 - 29 mmol/L   Calcium 8.8 8.7 - 10.2 mg/dL   Total Protein 6.4 6.0 - 8.5 g/dL   Albumin 4.3 3.5 - 5.5 g/dL   Globulin, Total 2.1 1.5 - 4.5 g/dL   Albumin/Globulin Ratio 2.0 1.2 - 2.2   Bilirubin Total 0.2 0.0 - 1.2 mg/dL   Alkaline Phosphatase 57 39 - 117 IU/L   AST 15 0 - 40 IU/L   ALT 12 0 - 32 IU/L  Lipid Panel w/o Chol/HDL Ratio  Result Value Ref Range   Cholesterol, Total 167 100 - 199 mg/dL   Triglycerides 58 0 - 149 mg/dL   HDL 87 >13 mg/dL   VLDL Cholesterol Cal 12 5 - 40 mg/dL   LDL Calculated 68 0 - 99 mg/dL  TSH  Result Value Ref Range   TSH 1.550 0.450 - 4.500 uIU/mL  VITAMIN D 25 Hydroxy (Vit-D Deficiency, Fractures)  Result Value Ref Range   Vit D, 25-Hydroxy 29.3 (L) 30.0 - 100.0 ng/mL      Assessment & Plan:   Problem List Items Addressed This Visit      Unprioritized   Anxiety    Stable, continue present medications.        Relevant Medications   FLUoxetine (PROZAC) 20 MG capsule   Depression    Stable, continue present medications.        Relevant Medications   FLUoxetine (PROZAC) 20 MG capsule    Other Visit Diagnoses    Breast lump on right side at 9 o'clock position    -  Primary   No distinct mass but area of fullness.  Suspect premenstrual changes.  RTC after menses if  not resolved   Great toe pain, left       Check uric acid.  If negative, refer to podiatry.  Rx for Medrol dose pack.  Get x-ray.  RTC if eleevated uric acid or x-ray comes back suggestive of gout   Relevant Orders   DG Foot Complete  Left   Uric acid       Follow up plan: Return if symptoms worsen or fail to improve.

## 2016-07-11 LAB — URIC ACID: URIC ACID: 3.7 mg/dL (ref 2.5–7.1)

## 2016-07-20 ENCOUNTER — Telehealth: Payer: Self-pay | Admitting: Unknown Physician Specialty

## 2016-07-20 DIAGNOSIS — M79673 Pain in unspecified foot: Secondary | ICD-10-CM

## 2016-07-20 NOTE — Telephone Encounter (Signed)
Routing to provider  

## 2016-07-20 NOTE — Telephone Encounter (Signed)
Patient called because her foot issue is no better.  She is wanting to know what Wanda Blanchard would recommend next such as a referral to a podiatrist..  Thanks  [phone]

## 2016-07-21 DIAGNOSIS — M79673 Pain in unspecified foot: Secondary | ICD-10-CM | POA: Insufficient documentation

## 2016-07-21 NOTE — Telephone Encounter (Signed)
Referral to podiatry  

## 2016-07-21 NOTE — Telephone Encounter (Signed)
Called and left patient a VM (signed DPR) letting her know that Wanda Blanchard entered a referral for her to go to podiatry and that they should be calling her with an appointment. I asked for the patient to give us a call if she does not hear from them within a week or two.

## 2016-08-22 ENCOUNTER — Ambulatory Visit (INDEPENDENT_AMBULATORY_CARE_PROVIDER_SITE_OTHER): Payer: Medicaid Other | Admitting: Podiatry

## 2016-08-22 ENCOUNTER — Encounter: Payer: Self-pay | Admitting: Podiatry

## 2016-08-22 DIAGNOSIS — M79673 Pain in unspecified foot: Secondary | ICD-10-CM

## 2016-08-22 DIAGNOSIS — M7752 Other enthesopathy of left foot: Secondary | ICD-10-CM

## 2016-08-22 MED ORDER — BETAMETHASONE SOD PHOS & ACET 6 (3-3) MG/ML IJ SUSP: 3.0000 mg | Freq: Once | INTRAMUSCULAR | Status: AC

## 2016-08-22 MED ORDER — DICLOFENAC SODIUM 75 MG PO TBEC: 75.0000 mg | DELAYED_RELEASE_TABLET | Freq: Two times a day (BID) | ORAL | 0 refills | Status: DC

## 2016-08-22 NOTE — Progress Notes (Signed)
   Subjective:    Patient ID: Wanda Blanchard, female    DOB: December 19, 1975, 41 y.o.   MRN: [id number]  HPI    Review of Systems  Constitutional: Positive for fatigue.  Cardiovascular:       Calf pain with walking   Musculoskeletal: Positive for back pain.       Joint pain Muscle pain    Neurological: Positive for numbness and headaches.       Objective:   Physical Exam        Assessment & Plan:

## 2016-08-22 NOTE — Progress Notes (Signed)
Patient ID: Wanda Blanchard, female   DOB: 1975/05/16, 41 y.o.   MRN: [id number]   HPI: 41 year old otherwise healthy female presents the office today for evaluation of left great toe pain. Patient states that over the course of the past year she has noticed significant pain and tenderness to the first MPJ left foot. Patient has noticed intermittent swelling. She currently wears tennis shoes and orthotics at work. Patient is on her feet all day long at work. 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 Patient presents today for further treatment and evaluation. X-rays taken on 07/10/2016 are negative for fracture and otherwise unremarkable.   Physical Exam: General: The patient is alert and oriented x3 in no acute distress.  Dermatology: Skin is warm, dry and supple bilateral lower extremities. Negative for open lesions or macerations.  Vascular: Palpable pedal pulses bilaterally. No edema or erythema noted. Capillary refill within normal limits.  Neurological: Epicritic and protective threshold grossly intact bilaterally.   Musculoskeletal Exam: Pain on palpation with range of motion noted to the first MPJ left foot. There is some moderate edema noted as well. Range of motion within normal limits to all pedal and ankle joints bilateral. Muscle strength 5/5 in all groups bilateral.   Assessment: 1. First metatarsophalangeal capsulitis left foot   Plan of Care:  1. Patient was evaluated. 2. Injection of 0.5 mL Celestone Soluspan injected in the first MTPJ left foot 3. Today a postoperative shoe was dispensed 4. Prescription for diclofenac 75 mg 5. Today no for work was provided to the patient to wear shoes while at work. 6. Return to clinic in 4 weeks   Felecia ShellingBrent M. Evans, DPM [facility]  Dr. Felecia ShellingBrent M. Evans, DPM    [address]                                        [place], [place] [id number]                Office [phone]  Fax [phone]

## 2016-09-19 ENCOUNTER — Ambulatory Visit: Payer: Medicaid Other | Admitting: Podiatry

## 2016-10-20 ENCOUNTER — Ambulatory Visit (INDEPENDENT_AMBULATORY_CARE_PROVIDER_SITE_OTHER): Payer: Medicaid Other | Admitting: Podiatry

## 2016-10-20 DIAGNOSIS — M7752 Other enthesopathy of left foot: Secondary | ICD-10-CM | POA: Diagnosis not present

## 2016-10-23 NOTE — Progress Notes (Signed)
Patient ID: Wanda Blanchard, female   DOB: 12-Aug-1975, 41 y.o.   MRN: [id number]   HPI: 41 year old female presents the office today for follow up evaluation of left great toe pain. She states her pain has returned and is the same as it was at her previous visit. She reports the toe swells and turns purple intermittently. She states the injection helped alleviate the pain for a few weeks. She is here for further evaluation and treatment.   Past Medical History:  Diagnosis Date  . Anxiety   . Constipation   . Depression   . Fibromyalgia   . Insomnia   . Migraine   . Myalgia and myositis   . Ruptured ovarian cyst   . Whole body pain     Physical Exam: General: The patient is alert and oriented x3 in no acute distress.  Dermatology: Skin is warm, dry and supple bilateral lower extremities. Negative for open lesions or macerations.  Vascular: Palpable pedal pulses bilaterally. No edema or erythema noted. Capillary refill within normal limits.  Neurological: Epicritic and protective threshold grossly intact bilaterally.   Musculoskeletal Exam: Pain on palpation with range of motion noted to the first MPJ left foot. There is some moderate edema noted as well. Range of motion within normal limits to all pedal and ankle joints bilateral. Muscle strength 5/5 in all groups bilateral.   Assessment: 1. First metatarsophalangeal capsulitis left foot   Plan of Care:  1. Patient was evaluated. 2. Recommended Aspercreme. 3. Continue diclofenac 75 mg when necessary. 4. Recommend good shoe gear. 5. Return to clinic when necessary.   Felecia Shelling, DPM [facility]  Dr. Felecia Shelling, DPM    [address] [place], [place] [address]                Office [phone]  Fax [phone]

## 2016-11-13 ENCOUNTER — Encounter: Payer: Self-pay | Admitting: Unknown Physician Specialty

## 2016-11-13 ENCOUNTER — Ambulatory Visit (INDEPENDENT_AMBULATORY_CARE_PROVIDER_SITE_OTHER): Payer: Medicaid Other | Admitting: Unknown Physician Specialty

## 2016-11-13 VITALS — BP 115/77 | HR 71 | Temp 98.6°F | Wt 114.4 lb

## 2016-11-13 DIAGNOSIS — Z77018 Contact with and (suspected) exposure to other hazardous metals: Secondary | ICD-10-CM | POA: Diagnosis not present

## 2016-11-13 DIAGNOSIS — M2550 Pain in unspecified joint: Secondary | ICD-10-CM

## 2016-11-13 DIAGNOSIS — M797 Fibromyalgia: Secondary | ICD-10-CM

## 2016-11-13 DIAGNOSIS — Z309 Encounter for contraceptive management, unspecified: Secondary | ICD-10-CM | POA: Diagnosis not present

## 2016-11-13 NOTE — Assessment & Plan Note (Signed)
Pt has concern that nickel is contributing to her symptoms from the Essure device.  Will refer to gyn to consider removal

## 2016-11-13 NOTE — Progress Notes (Signed)
BP 115/77   Pulse 71   Temp 98.6 F (37 C)   Wt 114 lb 6.4 oz (51.9 kg)   LMP 10/26/2016 (Exact Date)   SpO2 100%   BMI 18.03 kg/m    Subjective:    Patient ID: Wanda Blanchard, female    DOB: 1975/11/01, 41 y.o.   MRN: [id number]  HPI: Wanda Blanchard is a 41 y.o. female  Chief Complaint  Patient presents with  . 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 She states that she has been hearing things about this.    Pt has been  struggling with fatigue, chronic joint pain and eventually diagnosed with Fibromyalgia. She is concerned it is due to the Nickel located in the Essure implant she had placed about 10 years ago.    Depression screen [facility] 2/9 11/13/2016 07/10/2016 04/03/2016 09/27/2015 03/30/2015  Decreased Interest 2 1 0 2 1  Down, Depressed, Hopeless 2 1 0 1 1  PHQ - 2 Score 4 2 0 3 2  Altered sleeping _0 -  Tired, decreased energy _1 -  Change in appetite 3 2 0 2 -  Feeling bad or failure about yourself  0 0 0 1 -  Trouble concentrating 2 1 0 1 -  Moving slowly or fidgety/restless 1 0 0 0 -  Suicidal thoughts 0 0 0 0 -  PHQ-9 Score _2 -  Difficult doing work/chores Very difficult - - - -    Relevant past medical, surgical, family and social history reviewed and updated as indicated. Interim medical history since our last visit reviewed. Allergies and medications reviewed and updated.  Review of Systems  Per HPI unless specifically indicated above     Objective:    BP 115/77   Pulse 71   Temp 98.6 F (37 C)   Wt 114 lb 6.4 oz (51.9 kg)   LMP 10/26/2016 (Exact Date)   SpO2 100%   BMI 18.03 kg/m   Wt Readings from Last 3 Encounters:  11/13/16 114 lb 6.4 oz (51.9 kg)  07/10/16 117 lb (53.1 kg)  04/11/16 120 lb 9.6 oz (54.7 kg)    Physical Exam  Constitutional: She is oriented to person, place, and time. She appears well-developed and well-nourished. No distress.  HENT:  Head: Normocephalic  and atraumatic.  Eyes: Conjunctivae and lids are normal. Right eye exhibits no discharge. Left eye exhibits no discharge. No scleral icterus.  Neck: Normal range of motion. Neck supple. No JVD present. Carotid bruit is not present.  Cardiovascular: Normal rate, regular rhythm and normal heart sounds.   Pulmonary/Chest: Effort normal and breath sounds normal.  Abdominal: Normal appearance. There is no splenomegaly or hepatomegaly.  Musculoskeletal: Normal range of motion.  Neurological: She is alert and oriented to person, place, and time.  Skin: Skin is warm, dry and intact. No rash noted. No pallor.  Psychiatric: She has a normal mood and affect. Her behavior is normal. Judgment and thought content normal.    Results for orders placed or performed in visit on 07/10/16  Uric acid  Result Value Ref Range   Uric Acid 3.7 2.5 - 7.1 mg/dL      Assessment & Plan:   Problem List Items Addressed This Visit      Unprioritized   Fibromyalgia    Pt has concern that nickel is contributing to her  symptoms from the Essure device.  Will refer to gyn to consider removal      Relevant Orders   Ambulatory referral to Obstetrics / Gynecology    Other Visit Diagnoses    Arthralgia, unspecified joint    -  Primary   Relevant Orders   C-reactive protein   Sed Rate (ESR)   Ambulatory referral to Obstetrics / Gynecology   Encounter for contraceptive management, unspecified type       Relevant Orders   Ambulatory referral to Obstetrics / Gynecology   Contact with nickel dust       Check nickel labs       Follow up plan: No Follow-up on file.

## 2016-11-15 ENCOUNTER — Telehealth: Payer: Self-pay | Admitting: Certified Nurse Midwife

## 2016-11-15 LAB — C-REACTIVE PROTEIN: CRP: <0.3 mg/L (ref 0.0–4.9)

## 2016-11-15 LAB — NICKEL, PLASMA: NICKEL, PLASMA: 3.4 ug/L (ref 0.6–7.5)

## 2016-11-15 LAB — SEDIMENTATION RATE: SED RATE: 2 mm/h (ref 0–32)

## 2016-11-15 NOTE — Telephone Encounter (Signed)
Pt is schedule 11/20/16

## 2016-11-15 NOTE — Telephone Encounter (Signed)
[facility] referring pt for Encounter for contraceptive management, unspecified type. Spoke with pt, she will call back to schedule appt.

## 2016-11-20 ENCOUNTER — Encounter: Payer: Medicaid Other | Admitting: Obstetrics and Gynecology

## 2016-11-20 ENCOUNTER — Encounter: Payer: Self-pay | Admitting: Obstetrics & Gynecology

## 2016-11-20 ENCOUNTER — Ambulatory Visit (INDEPENDENT_AMBULATORY_CARE_PROVIDER_SITE_OTHER): Payer: Medicaid Other | Admitting: Obstetrics & Gynecology

## 2016-11-20 VITALS — BP 100/60 | HR 86 | Ht 67.0 in | Wt 113.0 lb

## 2016-11-20 DIAGNOSIS — N946 Dysmenorrhea, unspecified: Secondary | ICD-10-CM | POA: Insufficient documentation

## 2016-11-20 DIAGNOSIS — N920 Excessive and frequent menstruation with regular cycle: Secondary | ICD-10-CM | POA: Diagnosis not present

## 2016-11-20 DIAGNOSIS — T56891A Toxic effect of other metals, accidental (unintentional), initial encounter: Secondary | ICD-10-CM | POA: Insufficient documentation

## 2016-11-20 DIAGNOSIS — Z124 Encounter for screening for malignant neoplasm of cervix: Secondary | ICD-10-CM

## 2016-11-20 NOTE — Addendum Note (Signed)
Addended by: Nadara Mustard on: 11/20/2016 04:47 PM   Modules accepted: Orders

## 2016-11-20 NOTE — Patient Instructions (Signed)
Total Laparoscopic Hysterectomy °A total laparoscopic hysterectomy is a minimally invasive surgery to remove your uterus and cervix. This surgery is performed by making several small cuts (incisions) in your abdomen. It can also be done with a thin, lighted tube (laparoscope) inserted into two small incisions in your lower abdomen. Your fallopian tubes and ovaries can be removed (bilateral salpingo-oophorectomy) during this surgery as well. Benefits of minimally invasive surgery include: °· Less pain. °· Less risk of blood loss. °· Less risk of infection. °· Quicker return to normal activities. ° °Tell a health care provider about: °· Any allergies you have. °· All medicines you are taking, including vitamins, herbs, eye drops, creams, and over-the-counter medicines. °· Any problems you or family members have had with anesthetic medicines. °· Any blood disorders you have. °· Any surgeries you have had. °· Any medical conditions you have. °What are the risks? °Generally, this is a safe procedure. However, as with any procedure, complications can occur. Possible complications include: °· Bleeding. °· Blood clots in the legs or lung. °· Infection. °· Injury to surrounding organs. °· Problems with anesthesia. °· Early menopause symptoms (hot flashes, night sweats, insomnia). °· Risk of conversion to an open abdominal incision. ° °What happens before the procedure? °· Ask your health care provider about changing or stopping your regular medicines. °· Do not take aspirin or blood thinners (anticoagulants) for 1 week before the surgery or as told by your health care provider. °· Do not eat or drink anything for 8 hours before the surgery or as told by your health care provider. °· Quit smoking if you smoke. °· Arrange for a ride home after surgery and for someone to help you at home during recovery. °What happens during the procedure? °· You will be given antibiotic medicine. °· An IV tube will be placed in your arm. You  will be given medicine to make you sleep (general anesthetic). °· A gas (carbon dioxide) will be used to inflate your abdomen. This will allow your surgeon to look inside your abdomen, perform your surgery, and treat any other problems found if necessary. °· Three or four small incisions (often less than 1/2 inch) will be made in your abdomen. One of these incisions will be made in the area of your belly button (navel). The laparoscope will be inserted into the incision. Your surgeon will look through the laparoscope while doing your procedure. °· Other surgical instruments will be inserted through the other incisions. °· Your uterus may be removed through your vagina or cut into small pieces and removed through the small incisions. °· Your incisions will be closed. °What happens after the procedure? °· The gas will be released from inside your abdomen. °· You will be taken to the recovery area where a nurse will watch and check your progress. Once you are awake, stable, and taking fluids well, without other problems, you will return to your room or be allowed to go home. °· There is usually minimal discomfort following the surgery because the incisions are so small. °· You will be given pain medicine while you are in the hospital and for when you go home. °This information is not intended to replace advice given to you by your health care provider. Make sure you discuss any questions you have with your health care provider. °Document Released: 11/20/2006 Document Revised: 07/01/2015 Document Reviewed: 08/13/2012 °Elsevier Interactive Patient Education © 2017 [employer] ° °

## 2016-11-20 NOTE — Progress Notes (Signed)
Dysfunctional Uterine Bleeding Patient complains of regular but heavy and painful menses, for may years but seems to be excalating. Dysmenorrhea:severe, occurring throughout menses. Cyclic symptoms include: pelvic pain and tension. Current contraception: Essure 10 years ago. History of infertility: no. History of abnormal Pap smear: no.  She has associated sx's since the Essure of severe hair loss, fatigue, myalgias and arthralgias; feels there may be a component of nickel/metal effect from the device. Has been on muscle relaxants, pain meds, and anxiety meds with limited success.  Nothing improves these sx's or the bleeding sx's.  Recent nickel allergy test negative.     PMHx: She  has a past medical history of Anxiety; Constipation; Depression; Fibromyalgia; Insomnia; Migraine; Myalgia and myositis; Ruptured ovarian cyst; and Whole body pain. Also,  has a past surgical history that includes Cesarean section; Placement of breast implants (2009); and e sure., family history includes Autism in her son; Cancer in her father; Diabetes in her paternal uncle; Heart disease in her father; Lung disease in her father; Migraines in her mother; Stroke in her paternal aunt.,  reports that she quit smoking about 2 years ago. Her smoking use included Cigarettes. She has never used smokeless tobacco. She reports that she drinks alcohol. She reports that she does not use drugs.  She has a current medication list which includes the following prescription(s): biotin, vitamin d3, clonazepam, cyclobenzaprine, fluoxetine, multiple vitamins-minerals, sumatriptan, and tramadol, and the following Facility-Administered Medications: betamethasone acetate-betamethasone sodium phosphate. Also, has No Known Allergies.  Review of Systems  Constitutional: Negative for chills, fever and malaise/fatigue.  HENT: Negative for congestion, sinus pain and sore throat.   Eyes: Negative for blurred vision and pain.  Respiratory: Negative  for cough and wheezing.   Cardiovascular: Negative for chest pain and leg swelling.  Gastrointestinal: Negative for abdominal pain, constipation, diarrhea, heartburn, nausea and vomiting.  Genitourinary: Negative for dysuria, frequency, hematuria and urgency.  Musculoskeletal: Negative for back pain, joint pain, myalgias and neck pain.  Skin: Negative for itching and rash.  Neurological: Negative for dizziness, tremors and weakness.  Endo/Heme/Allergies: Does not bruise/bleed easily.  Psychiatric/Behavioral: Negative for depression. The patient is not nervous/anxious and does not have insomnia.     Objective: BP 100/60   Pulse 86   Ht  (1.702 m)   Wt 113 lb (51.3 kg)   LMP 10/26/2016 (Exact Date)   BMI 17.70 kg/m  Physical Exam  Constitutional: She is oriented to person, place, and time. She appears well-developed and well-nourished. No distress.  Genitourinary: Rectum normal, vagina normal and uterus normal. Pelvic exam was performed with patient supine. There is no rash or lesion on the right labia. There is no rash or lesion on the left labia. Vagina exhibits no lesion. No bleeding in the vagina. Right adnexum does not display mass and does not display tenderness. Left adnexum does not display mass and does not display tenderness. Cervix does not exhibit motion tenderness, lesion, friability or polyp.   Uterus is mobile and midaxial. Uterus is not enlarged or exhibiting a mass.  Genitourinary Comments: NT abd exam but bimanual exam towards either adnexa tender to patient  HENT:  Head: Normocephalic and atraumatic. Head is without laceration.  Right Ear: Hearing normal.  Left Ear: Hearing normal.  Nose: No epistaxis.  No foreign bodies.  Mouth/Throat: Uvula is midline, oropharynx is clear and moist and mucous membranes are normal.  Eyes: Pupils are equal, round, and reactive to light.  Neck: Normal range of motion. Neck  supple. No thyromegaly present.  Cardiovascular: Normal  rate and regular rhythm.  Exam reveals no gallop and no friction rub.   No murmur heard. Pulmonary/Chest: Effort normal and breath sounds normal. No respiratory distress. She has no wheezes. Right breast exhibits no mass, no skin change and no tenderness. Left breast exhibits no mass, no skin change and no tenderness.  Abdominal: Soft. Bowel sounds are normal. She exhibits no distension. There is no tenderness. There is no rebound.  Musculoskeletal: Normal range of motion.  Neurological: She is alert and oriented to person, place, and time. No cranial nerve deficit.  Skin: Skin is warm and dry.  Psychiatric: She has a normal mood and affect. Judgment normal.  Vitals reviewed.  ASSESSMENT/PLAN:   Problem List Items Addressed This Visit      Genitourinary   Dysmenorrhea   Relevant Orders   US PELVIS TRANSVANGINAL NON-OB (TV ONLY)     Other   Menorrhagia with regular cycle - Primary   Relevant Orders   US PELVIS TRANSVANGINAL NON-OB (TV ONLY)   Toxic effect of nickel compound   Relevant Orders   US PELVIS TRANSVANGINAL NON-OB (TV ONLY)    Options discussed for all areas, pt desires hysterectomy to remove Essure and its possible connection to some of her sx's.  Understands this cannot be proven by lab or other testing.  Alternatoves for menorrhagia therapy discussed such as Mirena, Ablation, and hormone tx- she declines these options.  F/U after [place]  Annamarie Major, MD, [facility], [facility] 11/20/2016  11:38 AM

## 2016-11-23 LAB — PAP IG (IMAGE GUIDED): PAP Smear Comment: 0

## 2016-12-01 ENCOUNTER — Encounter: Payer: Self-pay | Admitting: Obstetrics & Gynecology

## 2016-12-01 ENCOUNTER — Ambulatory Visit (INDEPENDENT_AMBULATORY_CARE_PROVIDER_SITE_OTHER): Payer: Medicaid Other

## 2016-12-01 ENCOUNTER — Ambulatory Visit (INDEPENDENT_AMBULATORY_CARE_PROVIDER_SITE_OTHER): Payer: Medicaid Other | Admitting: Obstetrics & Gynecology

## 2016-12-01 VITALS — BP 100/60 | Ht 67.0 in | Wt 112.0 lb

## 2016-12-01 DIAGNOSIS — N920 Excessive and frequent menstruation with regular cycle: Secondary | ICD-10-CM | POA: Diagnosis not present

## 2016-12-01 DIAGNOSIS — R102 Pelvic and perineal pain: Secondary | ICD-10-CM

## 2016-12-01 DIAGNOSIS — N946 Dysmenorrhea, unspecified: Secondary | ICD-10-CM

## 2016-12-01 DIAGNOSIS — T56891A Toxic effect of other metals, accidental (unintentional), initial encounter: Secondary | ICD-10-CM

## 2016-12-01 NOTE — Patient Instructions (Signed)
Total Laparoscopic Hysterectomy °A total laparoscopic hysterectomy is a minimally invasive surgery to remove your uterus and cervix. This surgery is performed by making several small cuts (incisions) in your abdomen. It can also be done with a thin, lighted tube (laparoscope) inserted into two small incisions in your lower abdomen. Your fallopian tubes and ovaries can be removed (bilateral salpingo-oophorectomy) during this surgery as well. Benefits of minimally invasive surgery include: °· Less pain. °· Less risk of blood loss. °· Less risk of infection. °· Quicker return to normal activities. ° °Tell a health care provider about: °· Any allergies you have. °· All medicines you are taking, including vitamins, herbs, eye drops, creams, and over-the-counter medicines. °· Any problems you or family members have had with anesthetic medicines. °· Any blood disorders you have. °· Any surgeries you have had. °· Any medical conditions you have. °What are the risks? °Generally, this is a safe procedure. However, as with any procedure, complications can occur. Possible complications include: °· Bleeding. °· Blood clots in the legs or lung. °· Infection. °· Injury to surrounding organs. °· Problems with anesthesia. °· Early menopause symptoms (hot flashes, night sweats, insomnia). °· Risk of conversion to an open abdominal incision. ° °What happens before the procedure? °· Ask your health care provider about changing or stopping your regular medicines. °· Do not take aspirin or blood thinners (anticoagulants) for 1 week before the surgery or as told by your health care provider. °· Do not eat or drink anything for 8 hours before the surgery or as told by your health care provider. °· Quit smoking if you smoke. °· Arrange for a ride home after surgery and for someone to help you at home during recovery. °What happens during the procedure? °· You will be given antibiotic medicine. °· An IV tube will be placed in your arm. You  will be given medicine to make you sleep (general anesthetic). °· A gas (carbon dioxide) will be used to inflate your abdomen. This will allow your surgeon to look inside your abdomen, perform your surgery, and treat any other problems found if necessary. °· Three or four small incisions (often less than 1/2 inch) will be made in your abdomen. One of these incisions will be made in the area of your belly button (navel). The laparoscope will be inserted into the incision. Your surgeon will look through the laparoscope while doing your procedure. °· Other surgical instruments will be inserted through the other incisions. °· Your uterus may be removed through your vagina or cut into small pieces and removed through the small incisions. °· Your incisions will be closed. °What happens after the procedure? °· The gas will be released from inside your abdomen. °· You will be taken to the recovery area where a nurse will watch and check your progress. Once you are awake, stable, and taking fluids well, without other problems, you will return to your room or be allowed to go home. °· There is usually minimal discomfort following the surgery because the incisions are so small. °· You will be given pain medicine while you are in the hospital and for when you go home. °This information is not intended to replace advice given to you by your health care provider. Make sure you discuss any questions you have with your health care provider. °Document Released: 11/20/2006 Document Revised: 07/01/2015 Document Reviewed: 08/13/2012 °Elsevier Interactive Patient Education © 2017 [employer] ° °

## 2016-12-01 NOTE — Progress Notes (Signed)
  HPI: Pain and bleeding with concerns over prior Essure. Patient complains of regular but heavy and painful menses, for may years but seems to be excalating. Dysmenorrhea:severe, occurring throughout menses. Cyclic symptoms include: pelvic pain and tension. Current contraception: Essure 10 years ago. She has associated sx's since the Essure of severe hair loss, fatigue, myalgias and arthralgias; feels there may be a component of nickel/metal effect from the device. Has been on muscle relaxants, pain meds, and anxiety meds with limited success. Nothing improves these sx's or the bleeding sx's. Recent nickel allergy test negative.     Ultrasound demonstrates no masses seen, essure seen These findings are c/w DUB w Essure in place  PMHx: She  has a past medical history of Anxiety; Constipation; Depression; Fibromyalgia; Insomnia; Migraine; Myalgia and myositis; Ruptured ovarian cyst; and Whole body pain. Also,  has a past surgical history that includes Cesarean section; Placement of breast implants (2009); and e sure., family history includes Autism in her son; Cancer in her father; Diabetes in her paternal uncle; Heart disease in her father; Lung disease in her father; Migraines in her mother; Stroke in her paternal aunt.,  reports that she quit smoking about 2 years ago. Her smoking use included Cigarettes. She has never used smokeless tobacco. She reports that she drinks alcohol. She reports that she does not use drugs.  She has a current medication list which includes the following prescription(s): biotin, vitamin d3, clonazepam, cyclobenzaprine, fluoxetine, multiple vitamins-minerals, sumatriptan, and tramadol, and the following Facility-Administered Medications: betamethasone acetate-betamethasone sodium phosphate. Also, has No Known Allergies.  Review of Systems  All other systems reviewed and are negative.  Objective: BP 100/60   Ht 5\' 7"  (1.702 m)   Wt 112 lb (50.8 kg)   LMP 11/23/2016    BMI 17.54 kg/m   Physical examination Constitutional NAD, Conversant  Skin No rashes, lesions or ulceration.   Extremities: Moves all appropriately.  Normal ROM for age. No lymphadenopathy.  Neuro: Grossly intact  Psych: Oriented to PPT.  Normal mood. Normal affect.   Assessment:  Dysmenorrhea and Menorrhagia with regular cycle and pelvic pain  Options discussed Treatment option for menorrhagia or menometrorrhagia discussed in great detail with the patient.  Options include hormonal therapy, IUD therapy such as Mirena, D&C, Ablation, and Hysterectomy.  The pros and cons of each option discussed with patient. Prefers hysterectomy.  Preserve ovaries.  Pros and cons discussed.  Sch Nov 27.  Info gv.  Annamarie MajorPaul Lorann Tani, MD, [facility], [facility] 12/01/2016  11:31 AM

## 2016-12-04 ENCOUNTER — Telehealth: Payer: Self-pay | Admitting: Obstetrics & Gynecology

## 2016-12-04 NOTE — Telephone Encounter (Signed)
I contacted the patient to schedule surgery, but the patient has thought about it over the weekend, and has decided not to proceed with surgery right now. Patient to call the office if she reconsiders in the future or needs anything else.

## 2016-12-04 NOTE — Telephone Encounter (Signed)
-----   Message from Nadara Mustardobert P Harris, MD sent at 12/01/2016 11:30 AM EDT ----- Regarding: surg NOV 27 Surgery Booking Request Patient Full Name:   MRN: [id number]  DOB: 24-Feb-1975  Surgeon: Letitia Libraobert Paul Harris, MD  Requested Surgery Date and Time: NOV 27 Primary Diagnosis AND Code: Menorrhagia, Pelvic pain, Dysmenorrhea Secondary Diagnosis and Code:  Surgical Procedure: TLH/BS L&D Notification: No Admission Status: same day surgery Length of Surgery: 1.2 hr Special Case Needs: no H&P: yes (date) Phone Interview???: yes Interpreter: Language:  Medical Clearance: no Special Scheduling Instructions: no

## 2016-12-10 NOTE — Telephone Encounter (Signed)
OK. Thx

## 2017-03-14 ENCOUNTER — Ambulatory Visit: Payer: Medicaid Other | Admitting: Unknown Physician Specialty

## 2017-05-09 ENCOUNTER — Encounter: Payer: Self-pay | Admitting: Unknown Physician Specialty

## 2017-05-09 ENCOUNTER — Ambulatory Visit
Admission: RE | Admit: 2017-05-09 | Discharge: 2017-05-09 | Disposition: A | Discharge status: Home / Self Care | Payer: Medicaid Other | Source: Ambulatory Visit | Attending: Unknown Physician Specialty | Admitting: Unknown Physician Specialty

## 2017-05-09 ENCOUNTER — Ambulatory Visit (INDEPENDENT_AMBULATORY_CARE_PROVIDER_SITE_OTHER): Payer: Medicaid Other | Admitting: Unknown Physician Specialty

## 2017-05-09 VITALS — BP 106/66 | HR 73 | Temp 98.5°F | Ht 67.0 in | Wt 116.5 lb

## 2017-05-09 DIAGNOSIS — M79673 Pain in unspecified foot: Secondary | ICD-10-CM

## 2017-05-09 DIAGNOSIS — M7071 Other bursitis of hip, right hip: Secondary | ICD-10-CM | POA: Insufficient documentation

## 2017-05-09 DIAGNOSIS — F32A Depression, unspecified: Secondary | ICD-10-CM

## 2017-05-09 DIAGNOSIS — M7061 Trochanteric bursitis, right hip: Secondary | ICD-10-CM | POA: Insufficient documentation

## 2017-05-09 DIAGNOSIS — F329 Major depressive disorder, single episode, unspecified: Secondary | ICD-10-CM

## 2017-05-09 DIAGNOSIS — M797 Fibromyalgia: Secondary | ICD-10-CM | POA: Diagnosis not present

## 2017-05-09 MED ORDER — CYCLOBENZAPRINE HCL 10 MG PO TABS: 10.0000 mg | ORAL_TABLET | Freq: Every day | ORAL | 3 refills | Status: DC

## 2017-05-09 MED ORDER — TRAMADOL HCL 50 MG PO TABS: 50.0000 mg | ORAL_TABLET | Freq: Four times a day (QID) | ORAL | 2 refills | Status: DC | PRN

## 2017-05-09 MED ORDER — DULOXETINE HCL 60 MG PO CPEP: 60.0000 mg | ORAL_CAPSULE | Freq: Every day | ORAL | 3 refills | Status: DC

## 2017-05-09 MED ORDER — CLONAZEPAM 0.5 MG PO TABS: 0.5000 mg | ORAL_TABLET | Freq: Every day | ORAL | 1 refills | Status: DC | PRN

## 2017-05-09 NOTE — Assessment & Plan Note (Signed)
Worsening.  Change Prozac to cymbalta

## 2017-05-09 NOTE — Assessment & Plan Note (Addendum)
Difficultly with generalized joint pain and some joints more specifically.  Will change Prozac to Cymbalta.  Check rheumatoid panel and ANA.  ESR and CRP were negative.  Taking Tramadol about #8-10/month.  Reviewed CSRS and only rx's from me.  Discussed with Dr. Wynetta Emery

## 2017-05-09 NOTE — Progress Notes (Signed)
BP 106/66   Pulse 73   Temp 98.5 F (36.9 C) (Oral)   Ht '5\' 7"'  (1.702 m)   Wt 116 lb 8 oz (52.8 kg)   SpO2 98%   BMI 18.25 kg/m    Subjective:    Patient ID: Wanda Blanchard, female    DOB: May 18, 1975, 42 y.o.   MRN: [id number]  HPI: Wanda Blanchard is a 42 y.o. female  Chief Complaint  Patient presents with  . Anxiety  . Joint Pain   Depression/anxiety States her depression and anxiety is worsening.  She feels it is due to body pain.  Lack of motivation due to pain.  Takes Clonazepam about once a week.  She has been on Prozac for a period of time.  She has tried Celexa, Wellbutrin, and Lexapro Depression screen [facility] 2/9 05/09/2017 11/13/2016 07/10/2016 04/03/2016 09/27/2015  Decreased Interest '2 2 1 ' 0 2  Down, Depressed, Hopeless '2 2 1 ' 0 1  PHQ - 2 Score '4 4 2 ' 0 3  Altered sleeping '2 2 2 2 2  ' Tired, decreased energy '2 3 2 1 1  ' Change in appetite '1 3 2 ' 0 2  Feeling bad or failure about yourself  1 0 0 0 1  Trouble concentrating '1 2 1 ' 0 1  Moving slowly or fidgety/restless 0 1 0 0 0  Suicidal thoughts 0 0 0 0 0  PHQ-9 Score '11 15 9 3 10  ' Difficult doing work/chores - Very difficult - - -   Fibromyalgia Aching is worse.  Complaining primarily of right hip and foot pain but having pain all over.  States for her right hip states her range of motion is decreased and having persistent pain despite yoga and massage therapy.  States it hurts to lay on her side.  However, all joints are sore to touch.  The other day could not open her Aleve due to hand pain.  Taking Tramadol more rarely.  Uses about 8-10 pills/month.  Takes nightly Flexeril  Foot pain Pt with pain bilateral foot pain.  She is having pain right large MTP joint.  Right small joint with similar symptoms.     Relevant past medical, surgical, family and social history reviewed and updated as indicated. Interim medical history since our last visit reviewed. Allergies and medications reviewed and updated.  Review of  Systems  Per HPI unless specifically indicated above     Objective:    BP 106/66   Pulse 73   Temp 98.5 F (36.9 C) (Oral)   Ht '5\' 7"'  (1.702 m)   Wt 116 lb 8 oz (52.8 kg)   SpO2 98%   BMI 18.25 kg/m   Wt Readings from Last 3 Encounters:  05/09/17 116 lb 8 oz (52.8 kg)  12/01/16 112 lb (50.8 kg)  11/20/16 113 lb (51.3 kg)    Physical Exam  Constitutional: She is oriented to person, place, and time. She appears well-developed and well-nourished. No distress.  HENT:  Head: Normocephalic and atraumatic.  Eyes: Conjunctivae and lids are normal. Right eye exhibits no discharge. Left eye exhibits no discharge. No scleral icterus.  Neck: Normal range of motion. Neck supple. No JVD present. Carotid bruit is not present.  Cardiovascular: Normal rate, regular rhythm and normal heart sounds.  Pulmonary/Chest: Effort normal and breath sounds normal.  Abdominal: Normal appearance. There is no splenomegaly or hepatomegaly.  Musculoskeletal: Normal range of motion.  Neurological: She is alert and oriented to person, place, and time.  Skin: Skin is warm, dry and intact. No rash noted. No pallor.  Psychiatric: She has a normal mood and affect. Her behavior is normal. Judgment and thought content normal.    Results for orders placed or performed in visit on 11/20/16  Pap IG (Image Guided)  Result Value Ref Range   DIAGNOSIS: Comment    Specimen adequacy: Comment    Clinician Provided ICD10 Comment    Performed by: Comment    QC reviewed by: Comment    PAP Smear Comment .    Note: Comment    Test Methodology Comment       Assessment & Plan:   Problem List Items Addressed This Visit      Unprioritized   Bursitis of right hip    Check x-ray.  Consider injection      Relevant Orders   DG HIP UNILAT WITH PELVIS MIN 4 VIEWS RIGHT   Depression    Worsening.  Change Prozac to cymbalta      Relevant Medications   DULoxetine (CYMBALTA) 60 MG capsule   Fibromyalgia - Primary     Difficultly with generalized joint pain and some joints more specifically.  Will change Prozac to Cymbalta.  Check rheumatoid panel and ANA.  ESR and CRP were negative.  Taking Tramadol about #8-10/month.  Reviewed CSRS and only rx's from me.  Discussed with Dr. Wynetta Emery      Relevant Orders   Rheumatoid Arthritis Profile   ANA w/Reflex   Foot pain    Bilateral foot pain, mostly in small and large MTP.  Refer back to podiatry.  Start cymbalta      Relevant Orders   Ambulatory referral to Podiatry       Follow up plan: Return in about 1 month (around 06/06/2017).

## 2017-05-09 NOTE — Assessment & Plan Note (Signed)
Bilateral foot pain, mostly in small and large MTP.  Refer back to podiatry.  Start cymbalta

## 2017-05-09 NOTE — Patient Instructions (Signed)
Hip Bursitis Hip bursitis is inflammation of a fluid-filled sac (bursa) in the hip joint. The bursa protects the bones in the hip joint from rubbing against each other. Hip bursitis can cause mild to moderate pain, and symptoms often come and go over time. What are the causes? This condition may be caused by:  Injury to the hip.  Overuse of the muscles that surround the hip joint.  Arthritis or gout.  Diabetes.  Thyroid disease.  Cold weather.  Infection.  In some cases, the cause may not be known. What are the signs or symptoms? Symptoms of this condition may include:  Mild or moderate pain in the hip area. Pain may get worse with movement.  Tenderness and swelling of the hip, especially on the outer side of the hip.  Symptoms may come and go. If the bursa becomes infected, you may have the following symptoms:  Fever.  Red skin and a feeling of warmth in the hip area.  How is this diagnosed? This condition may be diagnosed based on:  A physical exam.  Your medical history.  X-rays.  Removal of fluid from your inflamed bursa for testing (biopsy).  You may be sent to a health care provider who specializes in bone diseases (orthopedist) or a provider who specializes in joint inflammation (rheumatologist). How is this treated? This condition is treated by resting, raising (elevating), and applying pressure(compression) to the injured area. In some cases, this may be enough to make your symptoms go away. Treatment may also include:  Crutches.  Antibiotic medicine.  Draining fluid out of the bursa to help relieve swelling.  Injecting medicine that helps to reduce inflammation (cortisone).  Follow these instructions at home: Medicines  Take over-the-counter and prescription medicines only as told by your health care provider.  Do not drive or operate heavy machinery while taking prescription pain medicine, or as told by your health care provider.  If you were  prescribed an antibiotic, take it as told by your health care provider. Do not stop taking the antibiotic even if you start to feel better. Activity  Return to your normal activities as told by your health care provider. Ask your health care provider what activities are safe for you.  Rest and protect your hip as much as possible until your pain and swelling get better. General instructions  Wear compression wraps only as told by your health care provider.  Elevate your hip above the level of your heart as much as you can without pain. To do this, try putting a pillow under your hips while you lie down.  Do not use your hip to support your body weight until your health care provider says that you can. Use crutches as told by your health care provider.  Gently massage and stretch your injured area as often as is comfortable.  Keep all follow-up visits as told by your health care provider. This is important. How is this prevented?  Exercise regularly, as told by your health care provider.  Warm up and stretch before being active.  Cool down and stretch after being active.  If an activity irritates your hip or causes pain, avoid the activity as much as possible.  Avoid sitting down for long periods at a time. Contact a health care provider if:  You have a fever.  You develop new symptoms.  You have difficulty walking or doing everyday activities.  You have pain that gets worse or does not get better with medicine.  You   develop red skin or a feeling of warmth in your hip area. Get help right away if:  You cannot move your hip.  You have severe pain. This information is not intended to replace advice given to you by your health care provider. Make sure you discuss any questions you have with your health care provider. Document Released: 07/15/2001 Document Revised: 07/01/2015 Document Reviewed: 08/25/2014 Elsevier Interactive Patient Education  2018 [employer]vier [employer]  

## 2017-05-09 NOTE — Assessment & Plan Note (Signed)
Check x-ray.  Consider injection

## 2017-05-11 ENCOUNTER — Encounter: Payer: Self-pay | Admitting: Unknown Physician Specialty

## 2017-05-11 LAB — RHEUMATOID ARTHRITIS PROFILE
CYCLIC CITRULLIN PEPTIDE AB: 4 U (ref 0–19)
Rhuematoid fact SerPl-aCnc: <10 IU/mL (ref 0.0–13.9)

## 2017-05-11 LAB — ANA W/REFLEX: ANA: NEGATIVE

## 2017-05-14 ENCOUNTER — Encounter: Payer: Self-pay | Admitting: Unknown Physician Specialty

## 2017-05-14 ENCOUNTER — Ambulatory Visit: Payer: Medicaid Other | Admitting: Unknown Physician Specialty

## 2017-05-14 VITALS — BP 111/74 | HR 72 | Temp 98.6°F | Ht 67.0 in | Wt 113.8 lb

## 2017-05-14 DIAGNOSIS — M7061 Trochanteric bursitis, right hip: Secondary | ICD-10-CM

## 2017-05-14 MED ORDER — TRIAMCINOLONE ACETONIDE 40 MG/ML IJ SUSP
40.0000 mg | Freq: Once | INTRAMUSCULAR | Status: AC
  Administered 2017-05-14: 40 mg via INTRAMUSCULAR

## 2017-05-14 NOTE — Progress Notes (Signed)
   BP 111/74   Pulse 72   Temp 98.6 F (37 C) (Oral)   Ht 5\' 7"  (1.702 m)   Wt 113 lb 12.8 oz (51.6 kg)   LMP 04/18/2017 (Approximate)   SpO2 98%   BMI 17.82 kg/m    Subjective:    Patient ID: Wanda ShellingAnna M Blanchard, female    DOB: Sep 24, 1975, 42 y.o.   MRN: [id number]  HPI: Wanda Shellingnna M Blanchard is a 42 y.o. female  Chief Complaint  Patient presents with  . Hip Pain   Hip Pain   Incident onset: chronic. There was no injury mechanism. The pain is present in the right hip. The quality of the pain is described as aching (radiates down outer thigh). The pain is severe. The pain has been fluctuating since onset. Pertinent negatives include no inability to bear weight, loss of motion, loss of sensation, muscle weakness, numbness or tingling. The symptoms are aggravated by weight bearing. She has tried rest and NSAIDs for the symptoms. The treatment provided no relief.     Relevant past medical, surgical, family and social history reviewed and updated as indicated. Interim medical history since our last visit reviewed. Allergies and medications reviewed and updated.  Review of Systems  Neurological: Negative for tingling and numbness.    Per HPI unless specifically indicated above     Objective:    BP 111/74   Pulse 72   Temp 98.6 F (37 C) (Oral)   Ht 5\' 7"  (1.702 m)   Wt 113 lb 12.8 oz (51.6 kg)   LMP 04/18/2017 (Approximate)   SpO2 98%   BMI 17.82 kg/m   Wt Readings from Last 3 Encounters:  05/14/17 113 lb 12.8 oz (51.6 kg)  05/09/17 116 lb 8 oz (52.8 kg)  12/01/16 112 lb (50.8 kg)    Physical Exam  Constitutional: She is oriented to person, place, and time. She appears well-developed and well-nourished. No distress.  HENT:  Head: Normocephalic and atraumatic.  Eyes: Conjunctivae and lids are normal. Right eye exhibits no discharge. Left eye exhibits no discharge. No scleral icterus.  Cardiovascular: Normal rate.  Pulmonary/Chest: Effort normal.  Abdominal: Normal  appearance. There is no splenomegaly or hepatomegaly.  Musculoskeletal: Normal range of motion.  Point tender right greater trochanter  Neurological: She is alert and oriented to person, place, and time.  Skin: Skin is intact. No rash noted. No pallor.  Psychiatric: She has a normal mood and affect. Her behavior is normal. Judgment and thought content normal.    Results for orders placed or performed in visit on 05/09/17  Rheumatoid Arthritis Profile  Result Value Ref Range   Rhuematoid fact SerPl-aCnc <10.0 0.0 - 13.9 IU/mL   Cyclic Citrullin Peptide Ab 4 0 - 19 units  ANA w/Reflex  Result Value Ref Range   Anit Nuclear Antibody(ANA) Negative Negative      Assessment & Plan:   Problem List Items Addressed This Visit      Unprioritized   Bursitis of right hip - Primary   Relevant Medications   triamcinolone acetonide (KENALOG-40) injection 40 mg (Completed)       Patient was placed in side-lying position Point of maximum tenderness identified around greater trochanter.   After sterile prep, 1 cc of K40 with 9 cc's of Celtasone injected at point of maximum tenderness right hip.   Patient tolerated the procedure well   Follow up plan: Return if symptoms worsen or fail to improve.

## 2017-06-06 ENCOUNTER — Ambulatory Visit: Payer: Medicaid Other | Admitting: Unknown Physician Specialty

## 2017-06-20 ENCOUNTER — Encounter: Payer: Self-pay | Admitting: Unknown Physician Specialty

## 2017-06-20 ENCOUNTER — Ambulatory Visit (INDEPENDENT_AMBULATORY_CARE_PROVIDER_SITE_OTHER): Payer: Medicaid Other | Admitting: Unknown Physician Specialty

## 2017-06-20 DIAGNOSIS — M7061 Trochanteric bursitis, right hip: Secondary | ICD-10-CM | POA: Diagnosis not present

## 2017-06-20 DIAGNOSIS — Z79899 Other long term (current) drug therapy: Secondary | ICD-10-CM | POA: Diagnosis not present

## 2017-06-20 DIAGNOSIS — G5701 Lesion of sciatic nerve, right lower limb: Secondary | ICD-10-CM | POA: Diagnosis not present

## 2017-06-20 DIAGNOSIS — M797 Fibromyalgia: Secondary | ICD-10-CM

## 2017-06-20 NOTE — Assessment & Plan Note (Addendum)
Bursitis is better but now has piriformis pain Takes Flexeril at bed time for hip mayby 2-3 times/week.

## 2017-06-20 NOTE — Progress Notes (Signed)
   BP 112/73   Pulse 96   Wt 115 lb 6.4 oz (52.3 kg)   SpO2 100%   BMI 18.07 kg/m    Subjective:    Patient ID: Wanda Blanchard, female    DOB: 27-Jun-1975, 42 y.o.   MRN: [id number]  HPI: Wanda Blanchard is a 42 y.o. female  Chief Complaint  Patient presents with  . Follow-up    Hip pain,  . Fibromyalgia    Pt having good results on Cymbalta. Concerned about interaction swith imitrex and tramadol. Has not used either.    Fibromyalgia Pt having good results with her Cymbalta.  Taking 60 mg.  Pt has not required Tramadol since being on Cymbalta.    Right hip pain Pt states she is having right buttocks pain.  She is stretching and working with a Copywriter, advertising.     Relevant past medical, surgical, family and social history reviewed and updated as indicated. Interim medical history since our last visit reviewed. Allergies and medications reviewed and updated.  Review of Systems  Per HPI unless specifically indicated above     Objective:    BP 112/73   Pulse 96   Wt 115 lb 6.4 oz (52.3 kg)   SpO2 100%   BMI 18.07 kg/m   Wt Readings from Last 3 Encounters:  06/20/17 115 lb 6.4 oz (52.3 kg)  05/14/17 113 lb 12.8 oz (51.6 kg)  05/09/17 116 lb 8 oz (52.8 kg)    Physical Exam  Constitutional: She is oriented to person, place, and time. She appears well-developed and well-nourished. No distress.  HENT:  Head: Normocephalic and atraumatic.  Eyes: Conjunctivae and lids are normal. Right eye exhibits no discharge. Left eye exhibits no discharge. No scleral icterus.  Cardiovascular: Normal rate.  Pulmonary/Chest: Effort normal.  Abdominal: Normal appearance. There is no splenomegaly or hepatomegaly.  Musculoskeletal: Normal range of motion.  Neurological: She is alert and oriented to person, place, and time.  Skin: Skin is intact. No rash noted. No pallor.  Psychiatric: She has a normal mood and affect. Her behavior is normal. Judgment and thought content normal.     Results for orders placed or performed in visit on 05/09/17  Rheumatoid Arthritis Profile  Result Value Ref Range   Rhuematoid fact SerPl-aCnc <10.0 0.0 - 13.9 IU/mL   Cyclic Citrullin Peptide Ab 4 0 - 19 units  ANA w/Reflex  Result Value Ref Range   Anit Nuclear Antibody(ANA) Negative Negative      Assessment & Plan:   Problem List Items Addressed This Visit      Unprioritized   Bursitis of right hip    Bursitis is better but now has piriformis pain Takes Flexeril at bed time for hip mayby 2-3 times/week.        Controlled substance agreement signed    This was done last visit      Fibromyalgia    Doing much better on current medications.  States she mostly feels well but OK to take the occasional Tramadol (less than 3 times/week) and Imitrex.        Piriformis syndrome, right       Follow up plan: Return in about 3 months (around 09/20/2017).

## 2017-06-20 NOTE — Assessment & Plan Note (Signed)
Doing much better on current medications.  States she mostly feels well but OK to take the occasional Tramadol (less than 3 times/week) and Imitrex.

## 2017-06-20 NOTE — Assessment & Plan Note (Signed)
This was done last visit

## 2017-08-24 ENCOUNTER — Encounter: Payer: Self-pay | Admitting: Unknown Physician Specialty

## 2017-09-25 ENCOUNTER — Ambulatory Visit: Payer: Medicaid Other | Admitting: Unknown Physician Specialty

## 2017-09-26 ENCOUNTER — Ambulatory Visit (INDEPENDENT_AMBULATORY_CARE_PROVIDER_SITE_OTHER): Payer: Medicaid Other | Admitting: Physician Assistant

## 2017-09-26 ENCOUNTER — Encounter: Payer: Self-pay | Admitting: Physician Assistant

## 2017-09-26 ENCOUNTER — Other Ambulatory Visit: Payer: Self-pay

## 2017-09-26 VITALS — BP 104/72 | HR 74 | Temp 98.6°F | Ht 67.0 in | Wt 111.0 lb

## 2017-09-26 DIAGNOSIS — M4120 Other idiopathic scoliosis, site unspecified: Secondary | ICD-10-CM

## 2017-09-26 DIAGNOSIS — M797 Fibromyalgia: Secondary | ICD-10-CM

## 2017-09-26 DIAGNOSIS — R636 Underweight: Secondary | ICD-10-CM

## 2017-09-26 DIAGNOSIS — F419 Anxiety disorder, unspecified: Secondary | ICD-10-CM

## 2017-09-26 DIAGNOSIS — M5430 Sciatica, unspecified side: Secondary | ICD-10-CM

## 2017-09-26 NOTE — Patient Instructions (Signed)
Myofascial Pain Syndrome and Fibromyalgia Myofascial pain syndrome and fibromyalgia are both pain disorders. This pain may be felt mainly in your muscles.  Myofascial pain syndrome: ? Always has trigger points or tender points in the muscle that will cause pain when pressed. The pain may come and go. ? Usually affects your neck, upper back, and shoulder areas. The pain often radiates into your arms and hands.  Fibromyalgia: ? Has muscle pains and tenderness that come and go. ? Is often associated with fatigue and sleep disturbances. ? Has trigger points. ? Tends to be long-lasting (chronic), but is not life-threatening.  Fibromyalgia and myofascial pain are not the same. However, they often occur together. If you have both conditions, each can make the other worse. Both are common and can cause enough pain and fatigue to make day-to-day activities difficult. What are the causes? The exact causes of fibromyalgia and myofascial pain are not known. People with certain gene types may be more likely to develop fibromyalgia. Some factors can be triggers for both conditions, such as:  Spine disorders.  Arthritis.  Severe injury (trauma) and other physical stressors.  Being under a lot of stress.  A medical illness.  What are the signs or symptoms? Fibromyalgia The main symptom of fibromyalgia is widespread pain and tenderness in your muscles. This can vary over time. Pain is sometimes described as stabbing, shooting, or burning. You may have tingling or numbness, too. You may also have sleep problems and fatigue. You may wake up feeling tired and groggy (fibro fog). Other symptoms may include:  Bowel and bladder problems.  Headaches.  Visual problems.  Problems with odors and noises.  Depression or mood changes.  Painful menstrual periods (dysmenorrhea).  Dry skin or eyes.  Myofascial pain syndrome Symptoms of myofascial pain syndrome include:  Tight, ropy bands of  muscle.  Uncomfortable sensations in muscular areas, such as: ? Aching. ? Cramping. ? Burning. ? Numbness. ? Tingling. ? Muscle weakness.  Trouble moving certain muscles freely (range of motion).  How is this diagnosed? There are no specific tests to diagnose fibromyalgia or myofascial pain syndrome. Both can be hard to diagnose because their symptoms are common in many other conditions. Your health care provider may suspect one or both of these conditions based on your symptoms and medical history. Your health care provider will also do a physical exam. The key to diagnosing fibromyalgia is having pain, fatigue, and other symptoms for more than three months that cannot be explained by another condition. The key to diagnosing myofascial pain syndrome is finding trigger points in muscles that are tender and cause pain elsewhere in your body (referred pain). How is this treated? Treating fibromyalgia and myofascial pain often requires a team of health care providers. This usually starts with your primary provider and a physical therapist. You may also find it helpful to work with alternative health care providers, such as massage therapists or acupuncturists. Treatment for fibromyalgia may include medicines. This may include nonsteroidal anti-inflammatory drugs (NSAIDs), along with other medicines. Treatment for myofascial pain may also include:  NSAIDs.  Cooling and stretching of muscles.  Trigger point injections.  Sound wave (ultrasound) treatments to stimulate muscles.  Follow these instructions at home:  Take medicines only as directed by your health care provider.  Exercise as directed by your health care provider or physical therapist.  Try to avoid stressful situations.  Practice relaxation techniques to control your stress. You may want to try: ? Biofeedback. ? Visual   imagery. ? Hypnosis. ? Muscle relaxation. ? Yoga. ? Meditation.  Talk to your health care provider  about alternative treatments, such as acupuncture or massage treatment.  Maintain a healthy lifestyle. This includes eating a healthy diet and getting enough sleep.  Consider joining a support group.  Do not do activities that stress or strain your muscles. That includes repetitive motions and heavy lifting. Where to find more information:  National Fibromyalgia Association: www.fmaware.org  Arthritis Foundation: www.arthritis.org  American Chronic Pain Association: www.theacpa.org/condition/myofascial-pain Contact a health care provider if:  You have new symptoms.  Your symptoms get worse.  You have side effects from your medicines.  You have trouble sleeping.  Your condition is causing depression or anxiety. This information is not intended to replace advice given to you by your health care provider. Make sure you discuss any questions you have with your health care provider. Document Released: 01/23/2005 Document Revised: 07/01/2015 Document Reviewed: 10/29/2013 Elsevier Interactive Patient Education  2018 Elsevier Inc.  

## 2017-09-26 NOTE — Progress Notes (Signed)
Subjective:    Patient ID: Wanda Blanchard, female    DOB: 1975/11/13, 42 y.o.   MRN: [id number]  Wanda Blanchard is a 42 y.o. female presenting on 09/26/2017 for Fibromyalgia (3 m f/u/ Cymbalta fu, pt states this med is working well); Back Pain (lower); Hip Pain (right side pain runs down to the foot, worse when driving/ pt states if she can get a referral for a chiropractor); and Other (pt concerned of losing weight)   HPI   Longstanding history of scoliosis. Also has leg length discrepancy. Would like referral to chriopractor. Does have low back pain related to this and pain running down her right leg. Does yoga and stretching. She has never been evaluated by orthopedist.   Fibromyalgia, treated with cymbalta which has really worked well for her.   Wonders about gaining weight.   Anxiety: using cymbalta 60 mg and klonopin 0.5 mg nightly. Stable on this, mood is stable.   Wonders about her weight. She has always been a thin person. She does not eat much. 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   Wt Readings from Last 3 Encounters:  09/26/17 111 lb (50.3 kg)  06/20/17 115 lb 6.4 oz (52.3 kg)  05/14/17 113 lb 12.8 oz (51.6 kg)     Social History   Tobacco Use  . Smoking status: Former Smoker    Types: Cigarettes    Last attempt to quit: 04/21/2014    Years since quitting: 3.4  . Smokeless tobacco: Never Used  Substance Use Topics  . Alcohol use: Yes    Alcohol/week: 0.0 standard drinks    Comment: pt states every now and then  . Drug use: No    Review of Systems Per HPI unless specifically indicated above     Objective:    BP 104/72   Pulse 74   Temp 98.6 F (37 C) (Oral)   Ht 5\' 7"  (1.702 m)   Wt 111 lb (50.3 kg)   SpO2 99%   BMI 17.39 kg/m   Wt Readings from Last 3 Encounters:  09/26/17 111 lb (50.3 kg)  06/20/17 115 lb 6.4 oz (52.3 kg)  05/14/17 113 lb 12.8 oz (51.6 kg)    Physical Exam  Constitutional: She appears well-developed and  well-nourished.  Cardiovascular: Normal rate and regular rhythm.  Pulmonary/Chest: Effort normal and breath sounds normal.  Musculoskeletal:  Shoulders and ASIS at different height. Right leg shorter than left leg.   Skin: Skin is warm and dry.  Psychiatric: She has a normal mood and affect. Her behavior is normal.   Results for orders placed or performed in visit on 05/09/17  Rheumatoid Arthritis Profile  Result Value Ref Range   Rhuematoid fact SerPl-aCnc <10.0 0.0 - 13.9 IU/mL   Cyclic Citrullin Peptide Ab 4 0 - 19 units  ANA w/Reflex  Result Value Ref Range   Anti Nuclear Antibody(ANA) Negative Negative      Assessment & Plan:  1. Fibromyalgia  Continue cymbalta, yoga.  2. Other idiopathic scoliosis, unspecified spinal region  Refer as below, want her to see Dr. Yves Dillhasnis.   - Ambulatory referral to Chiropractic - Ambulatory referral to Orthopedics  3. Sciatic leg pain  - Ambulatory referral to Orthopedics  4. Anxiety  - clonazePAM (KLONOPIN) 0.5 MG tablet; Take 1 tablet (0.5 mg total) by mouth daily as needed.  Dispense: 30 tablet; Refill: 1  5. Underweight  She is slightly underweight today, encourage to eat  small frequent meals. She can use meal replacement/protein shake as a snack.     Follow up plan: Return in about 3 months (around 12/27/2017) for anxiety, fibromyalgia.   Osvaldo AngstAdriana Pollak, PA-C [facility]  [facility] 09/28/2017, 4:29 PM

## 2017-09-28 MED ORDER — CLONAZEPAM 0.5 MG PO TABS: 0.5000 mg | ORAL_TABLET | Freq: Every day | ORAL | 1 refills | Status: DC | PRN

## 2017-10-10 DIAGNOSIS — M9903 Segmental and somatic dysfunction of lumbar region: Secondary | ICD-10-CM | POA: Diagnosis not present

## 2017-10-10 DIAGNOSIS — M41117 Juvenile idiopathic scoliosis, lumbosacral region: Secondary | ICD-10-CM | POA: Diagnosis not present

## 2017-10-10 DIAGNOSIS — M9901 Segmental and somatic dysfunction of cervical region: Secondary | ICD-10-CM | POA: Diagnosis not present

## 2017-10-10 DIAGNOSIS — M5412 Radiculopathy, cervical region: Secondary | ICD-10-CM | POA: Diagnosis not present

## 2017-10-12 DIAGNOSIS — M9901 Segmental and somatic dysfunction of cervical region: Secondary | ICD-10-CM | POA: Diagnosis not present

## 2017-10-12 DIAGNOSIS — M41117 Juvenile idiopathic scoliosis, lumbosacral region: Secondary | ICD-10-CM | POA: Diagnosis not present

## 2017-10-12 DIAGNOSIS — M9903 Segmental and somatic dysfunction of lumbar region: Secondary | ICD-10-CM | POA: Diagnosis not present

## 2017-10-12 DIAGNOSIS — M5412 Radiculopathy, cervical region: Secondary | ICD-10-CM | POA: Diagnosis not present

## 2017-10-15 DIAGNOSIS — M5412 Radiculopathy, cervical region: Secondary | ICD-10-CM | POA: Diagnosis not present

## 2017-10-15 DIAGNOSIS — M9903 Segmental and somatic dysfunction of lumbar region: Secondary | ICD-10-CM | POA: Diagnosis not present

## 2017-10-15 DIAGNOSIS — M41117 Juvenile idiopathic scoliosis, lumbosacral region: Secondary | ICD-10-CM | POA: Diagnosis not present

## 2017-10-15 DIAGNOSIS — M9901 Segmental and somatic dysfunction of cervical region: Secondary | ICD-10-CM | POA: Diagnosis not present

## 2017-10-17 DIAGNOSIS — M9901 Segmental and somatic dysfunction of cervical region: Secondary | ICD-10-CM | POA: Diagnosis not present

## 2017-10-17 DIAGNOSIS — M5412 Radiculopathy, cervical region: Secondary | ICD-10-CM | POA: Diagnosis not present

## 2017-10-17 DIAGNOSIS — M41117 Juvenile idiopathic scoliosis, lumbosacral region: Secondary | ICD-10-CM | POA: Diagnosis not present

## 2017-10-17 DIAGNOSIS — M9903 Segmental and somatic dysfunction of lumbar region: Secondary | ICD-10-CM | POA: Diagnosis not present

## 2017-10-19 DIAGNOSIS — M5412 Radiculopathy, cervical region: Secondary | ICD-10-CM | POA: Diagnosis not present

## 2017-10-19 DIAGNOSIS — M9901 Segmental and somatic dysfunction of cervical region: Secondary | ICD-10-CM | POA: Diagnosis not present

## 2017-10-19 DIAGNOSIS — M41117 Juvenile idiopathic scoliosis, lumbosacral region: Secondary | ICD-10-CM | POA: Diagnosis not present

## 2017-10-19 DIAGNOSIS — M9903 Segmental and somatic dysfunction of lumbar region: Secondary | ICD-10-CM | POA: Diagnosis not present

## 2017-10-23 DIAGNOSIS — M5412 Radiculopathy, cervical region: Secondary | ICD-10-CM | POA: Diagnosis not present

## 2017-10-23 DIAGNOSIS — M9901 Segmental and somatic dysfunction of cervical region: Secondary | ICD-10-CM | POA: Diagnosis not present

## 2017-10-23 DIAGNOSIS — M41117 Juvenile idiopathic scoliosis, lumbosacral region: Secondary | ICD-10-CM | POA: Diagnosis not present

## 2017-10-23 DIAGNOSIS — M9903 Segmental and somatic dysfunction of lumbar region: Secondary | ICD-10-CM | POA: Diagnosis not present

## 2017-10-24 DIAGNOSIS — M9901 Segmental and somatic dysfunction of cervical region: Secondary | ICD-10-CM | POA: Diagnosis not present

## 2017-10-24 DIAGNOSIS — M9903 Segmental and somatic dysfunction of lumbar region: Secondary | ICD-10-CM | POA: Diagnosis not present

## 2017-10-24 DIAGNOSIS — M41117 Juvenile idiopathic scoliosis, lumbosacral region: Secondary | ICD-10-CM | POA: Diagnosis not present

## 2017-10-24 DIAGNOSIS — M5412 Radiculopathy, cervical region: Secondary | ICD-10-CM | POA: Diagnosis not present

## 2017-10-29 DIAGNOSIS — M9901 Segmental and somatic dysfunction of cervical region: Secondary | ICD-10-CM | POA: Diagnosis not present

## 2017-10-29 DIAGNOSIS — M41117 Juvenile idiopathic scoliosis, lumbosacral region: Secondary | ICD-10-CM | POA: Diagnosis not present

## 2017-10-29 DIAGNOSIS — M9903 Segmental and somatic dysfunction of lumbar region: Secondary | ICD-10-CM | POA: Diagnosis not present

## 2017-10-29 DIAGNOSIS — M5412 Radiculopathy, cervical region: Secondary | ICD-10-CM | POA: Diagnosis not present

## 2017-11-16 ENCOUNTER — Other Ambulatory Visit: Payer: Self-pay | Admitting: Physical Medicine and Rehabilitation

## 2017-11-16 DIAGNOSIS — M5441 Lumbago with sciatica, right side: Secondary | ICD-10-CM

## 2017-11-22 ENCOUNTER — Other Ambulatory Visit: Payer: Self-pay | Admitting: Unknown Physician Specialty

## 2017-11-22 DIAGNOSIS — F419 Anxiety disorder, unspecified: Secondary | ICD-10-CM

## 2017-11-23 NOTE — Telephone Encounter (Signed)
Left message on machine for pt to return call to the office.  

## 2017-11-23 NOTE — Telephone Encounter (Signed)
Can we see if this patient can get into office sooner?  She needs refill on benzo, but does not have an appt for another month.  If not I will just provide a 30 day supply until she is seen.

## 2017-11-23 NOTE — Telephone Encounter (Signed)
Requested medication (s) are due for refill today: yes  Requested medication (s) are on the active medication list: yes  Last refill:  08/24/17  Future visit scheduled: yes  Notes to clinic:  undelegated    Requested Prescriptions  Pending Prescriptions Disp Refills   clonazePAM (KLONOPIN) 0.5 MG tablet [Pharmacy Med Name: CLONAZEPAM 0.5 MG TABLET] 30 tablet     Sig: TAKE 1 TABLET BY MOUTH EVERY DAY AS NEEDED     Not Delegated - Psychiatry:  Anxiolytics/Hypnotics Failed - 11/22/2017  8:29 PM      Failed - This refill cannot be delegated      Failed - Urine Drug Screen completed in last 360 days.      Passed - Valid encounter within last 6 months    Recent Outpatient Visits          1 month ago Fibromyalgia   [facility] Osvaldo Angst M, PA-C   5 months ago Controlled substance agreement signed   [facility] Gabriel Cirri, NP   6 months ago Trochanteric bursitis of right hip   [facility] Gabriel Cirri, NP   6 months ago Fibromyalgia   [facility] Gabriel Cirri, NP   1 year ago Arthralgia, unspecified joint   [facility] Gabriel Cirri, NP      Future Appointments            In 1 month Cannady, Dorie Rank, NP [facility]         2

## 2017-11-26 NOTE — Telephone Encounter (Signed)
Called patient, left a message asking patient to return my call to let us know if she can or can't come in before November.

## 2017-11-28 ENCOUNTER — Encounter: Payer: Self-pay | Admitting: Nurse Practitioner

## 2017-11-28 ENCOUNTER — Ambulatory Visit (INDEPENDENT_AMBULATORY_CARE_PROVIDER_SITE_OTHER): Payer: Medicaid Other | Admitting: Nurse Practitioner

## 2017-11-28 ENCOUNTER — Other Ambulatory Visit: Payer: Self-pay

## 2017-11-28 DIAGNOSIS — F419 Anxiety disorder, unspecified: Secondary | ICD-10-CM | POA: Diagnosis not present

## 2017-11-28 DIAGNOSIS — M797 Fibromyalgia: Secondary | ICD-10-CM | POA: Diagnosis not present

## 2017-11-28 MED ORDER — TRAMADOL HCL 50 MG PO TABS: 50.0000 mg | ORAL_TABLET | Freq: Four times a day (QID) | ORAL | 2 refills | Status: DC | PRN

## 2017-11-28 MED ORDER — CLONAZEPAM 0.5 MG PO TABS
0.5000 mg | ORAL_TABLET | Freq: Every day | ORAL | 1 refills | Status: DC | PRN
Start: 2017-11-28 — End: 2018-03-11

## 2017-11-28 NOTE — Assessment & Plan Note (Signed)
Chronic, stable on current medication regimen.  Duloxetine and Klonopin (takes Klonopin 2-3 times week at night).  Continue current regimen.

## 2017-11-28 NOTE — Assessment & Plan Note (Signed)
Chronic, ongoing.  Currently followed by ortho for for hip pain.  Takes tramadol 2-3 times a week and Imitrex.  Duloxetine daily.  Reports ability to function and perform ADL with current regimen.

## 2017-11-28 NOTE — Patient Instructions (Signed)
Magnetic Resonance Cholangiopancreatogram Magnetic resonance imaging (MRI) is a type of procedure that is used to produce pictures of the inside of the body without using X-rays. Instead, strong magnets and radio waves work together in a magnetic field to form very detailed and sharp images. The images are viewed on a TV monitor in two-dimensional and three-dimensional form. The magnets and the radio waves are harmless. Magnetic resonance cholangiopancreatogram, or magnetic resonance cholangiopancreatography (MRCP), is an MRI that is done on your gallbladder, bile duct, pancreas, and pancreatic duct. MRCP produces detailed images of these organs. It can be used to help diagnose problems such as tumors, stones, infection, or inflammation. It is sometimes used to help determine the cause of pancreatitis or belly (abdominal) pain. Contrast material may be injected to make MRCP images even more clear. Tell a health care provider about:  Any allergies you have.  All medicines you are taking, including vitamins, herbs, eye drops, creams, and over-the-counter medicines.  Any surgeries you have had.  Medical conditions you have, including kidney disease.  Any metal you may have in your body. The magnet used in this procedure can cause metal objects in your body to move. Metal can also make it hard to get high-quality images. Objects that contain metal include: ? A pacemaker or any other implants, such as an implanted neurostimulator, a metallic ear implant, or a metallic object within the eye socket. ? Metal splinters. ? Any bullet fragments. ? A port for delivering insulin or chemotherapy.  Any tattoos you have. Some red dyes contain iron, which is sometimes a problem.  If you are pregnant or you think that you may be pregnant. It is best to avoid this test during the first 3 months of pregnancy unless there is a significant risk of missing a serious diagnosis without performing the test.  If you  are breastfeeding.  If you are afraid of cramped spaces (claustrophobic). If claustrophobia is a problem for you, it can usually be relieved with mild sedatives or antianxiety medicines. What happens before the procedure?  You will be asked to remove anything that contains metal, such as a watch or any jewelry that you are wearing. You may also be asked to remove any makeup because some makeup contains traces of metal. Braces and fillings are usually not a problem.  Women who are breastfeeding may need to pump breast milk before the exam so that they have milk to give to their baby until the contrast material (if used) has cleared from their body. What happens during the procedure?  You may be given earplugs because the machine that is used can be noisy. Headphones may also be available so you can listen to music.  If a contrast material will be used, an IV tube will be inserted into one of your veins. The contrast material will be injected through the tube.  You will lie down on a platform.  The platform will slide into a long, magnetic chamber. When you are inside the chamber, you will still be able to talk to the health care provider.  You will be asked to lie very still. The health care provider will tell you when you can shift position. You may have to wait a few minutes to make sure that the images produced during the procedure are readable. The procedure may vary among health care providers and hospitals. What happens after the procedure?  Return to your normal activities as directed by your health care provider.  If contrast   material was used, it will pass from your body within a day.  A health care provider who is experienced in MRCP will analyze the results and send a report and an interpretation of the findings to your health care provider.  It is your responsibility to obtain your test results. Ask your health care provider or the department performing the test when and how  you will get your results. This information is not intended to replace advice given to you by your health care provider. Make sure you discuss any questions you have with your health care provider. Document Released: 07/12/2007 Document Revised: 07/01/2015 Document Reviewed: 11/04/2013 Elsevier Interactive Patient Education  2018 Elsevier Inc.  

## 2017-11-28 NOTE — Progress Notes (Signed)
BP 99/67   Pulse 61   Temp (!) 97.5 F (36.4 C) (Oral)   Ht 5\' 7"  (1.702 m)   Wt 117 lb (53.1 kg)   SpO2 97%   BMI 18.32 kg/m    Subjective:    Patient ID: Wanda Blanchard, female    DOB: 05-25-1975, 42 y.o.   MRN: [id number]  HPI: [name] is a 42 y.o. female presents for f/u visit fibromyalgia, anxiety, and medication refills.  Chief Complaint  Patient presents with  . Anxiety    42m f/u  . Fibromyalgia  . Medication Refill    klonopin, tramadol   Fibromyalgia: Initially diagnosed in 2012.  Is able to function on a daily basis with Tramadol. Also uses Cymbalta in morning, which helps her through daytime hours.  At night she occasionally uses Tramadol.  Does not take Klonopin and Tramadol at the same time.  Uses Tramadol 2-3 times during the week.  Is seeing [facility].  Has visited with NP Meeler and received a steroid injection to right hip.  Initially worked, but then "wore off".  Has scheduled MRI on Saturday.  Has been going to chiropractor since August for adjustments, has relief for three hours than pain returns.  ANXIETY/STRESS: Takes Klonopin 2-3 nights a week for anxiety and poor sleep. Duration:controlled Anxious mood: yes  Excessive worrying: no Irritability: no  Sweating: no Nausea: no Palpitations:no Hyperventilation: no Panic attacks: no Agoraphobia: no  Obscessions/compulsions: no Depressed mood: no Depression screen [facility] 2/9 11/28/2017 09/26/2017 05/09/2017 11/13/2016 07/10/2016  Decreased Interest 2 1 2 2 1   Down, Depressed, Hopeless 1 1 2 2 1   PHQ - 2 Score 3 2 4 4 2   Altered sleeping 2 1 2 2 2   Tired, decreased energy 2 1 2 3 2   Change in appetite 1 3 1 3 2   Feeling bad or failure about yourself  1 0 1 0 0  Trouble concentrating 0 0 1 2 1   Moving slowly or fidgety/restless 1 0 0 1 0  Suicidal thoughts 0 0 0 0 0  PHQ-9 Score 10 7 11 15 9   Difficult doing work/chores - - - Very difficult -   GAD 7 : Generalized Anxiety Score  11/28/2017 09/26/2017  Nervous, Anxious, on Edge 1 1  Control/stop worrying 1 1  Worry too much - different things 2 1  Trouble relaxing 2 1  Restless 0 0  Easily annoyed or irritable 1 2  Afraid - awful might happen 0 0  Total GAD 7 Score 7 6    Anhedonia: no Weight changes: no Insomnia: yes hard to fall asleep  Hypersomnia: no Fatigue/loss of energy: no Feelings of worthlessness: no Feelings of guilt: no Impaired concentration/indecisiveness: no Suicidal ideations: no  Crying spells: no Recent Stressors/Life Changes: no   Relationship problems: no   Family stress: no     Financial stress: no    Job stress: no    Recent death/loss: no  Relevant past medical, surgical, family and social history reviewed and updated as indicated. Interim medical history since our last visit reviewed. Allergies and medications reviewed and updated.  Review of Systems  Constitutional: Negative for activity change, appetite change, diaphoresis, fatigue and fever.  Respiratory: Negative for cough, chest tightness and shortness of breath.   Cardiovascular: Negative for chest pain, palpitations and leg swelling.  Gastrointestinal: Negative for abdominal distention, abdominal pain, constipation, diarrhea, nausea and vomiting.  Musculoskeletal: Positive for arthralgias.  Neurological: Negative for dizziness,  numbness and headaches.  Psychiatric/Behavioral: Negative for behavioral problems, confusion and decreased concentration. The patient is nervous/anxious.    Per HPI unless specifically indicated above     Objective:    BP 99/67   Pulse 61   Temp (!) 97.5 F (36.4 C) (Oral)   Ht 5\' 7"  (1.702 m)   Wt 117 lb (53.1 kg)   SpO2 97%   BMI 18.32 kg/m   Wt Readings from Last 3 Encounters:  11/28/17 117 lb (53.1 kg)  09/26/17 111 lb (50.3 kg)  06/20/17 115 lb 6.4 oz (52.3 kg)    Physical Exam  Constitutional: She is oriented to person, place, and time. She appears well-developed and  well-nourished.  HENT:  Head: Normocephalic and atraumatic.  Eyes: Pupils are equal, round, and reactive to light.  Neck: Normal range of motion.  Cardiovascular: Normal rate, regular rhythm and normal heart sounds.  Pulmonary/Chest: Effort normal and breath sounds normal.  Abdominal: Soft. Bowel sounds are normal.  Musculoskeletal: Normal range of motion.  Neurological: She is alert and oriented to person, place, and time.  Skin: Skin is warm and dry.  Psychiatric: She has a normal mood and affect. Her behavior is normal. Judgment and thought content normal.     Results for orders placed or performed in visit on 05/09/17  Rheumatoid Arthritis Profile  Result Value Ref Range   Rhuematoid fact SerPl-aCnc <10.0 0.0 - 13.9 IU/mL   Cyclic Citrullin Peptide Ab 4 0 - 19 units  ANA w/Reflex  Result Value Ref Range   Anti Nuclear Antibody(ANA) Negative Negative      Assessment & Plan:   Problem List Items Addressed This Visit      Other   Anxiety    Chronic, stable on current medication regimen.  Duloxetine and Klonopin (takes Klonopin 2-3 times week at night).  Continue current regimen.      Relevant Medications   clonazePAM (KLONOPIN) 0.5 MG tablet   Fibromyalgia    Chronic, ongoing.  Currently followed by ortho for for hip pain.  Takes tramadol 2-3 times a week and Imitrex.  Duloxetine daily.  Reports ability to function and perform ADL with current regimen.         Controlled substance.  Checked data base and no other refills of controlled substances noted over past three months.  Follow up plan: Return in about 3 months (around 02/28/2018) for Anxiety and chronic pain.

## 2017-12-01 ENCOUNTER — Ambulatory Visit
Admission: RE | Admit: 2017-12-01 | Discharge: 2017-12-01 | Disposition: A | Discharge status: Home / Self Care | Payer: Medicaid Other | Source: Ambulatory Visit | Attending: Physical Medicine and Rehabilitation | Admitting: Physical Medicine and Rehabilitation

## 2017-12-01 DIAGNOSIS — M5126 Other intervertebral disc displacement, lumbar region: Secondary | ICD-10-CM | POA: Diagnosis not present

## 2017-12-01 DIAGNOSIS — M419 Scoliosis, unspecified: Secondary | ICD-10-CM | POA: Diagnosis not present

## 2017-12-01 DIAGNOSIS — M48061 Spinal stenosis, lumbar region without neurogenic claudication: Secondary | ICD-10-CM | POA: Insufficient documentation

## 2017-12-01 DIAGNOSIS — M5441 Lumbago with sciatica, right side: Secondary | ICD-10-CM

## 2017-12-01 DIAGNOSIS — M545 Low back pain: Secondary | ICD-10-CM | POA: Diagnosis not present

## 2017-12-07 ENCOUNTER — Telehealth: Payer: Self-pay

## 2017-12-07 NOTE — Telephone Encounter (Signed)
PA initiated with [facility]. Pending review.

## 2017-12-08 ENCOUNTER — Other Ambulatory Visit: Payer: Self-pay | Admitting: Unknown Physician Specialty

## 2017-12-10 ENCOUNTER — Other Ambulatory Visit: Payer: Self-pay | Admitting: *Deleted

## 2017-12-10 DIAGNOSIS — M5442 Lumbago with sciatica, left side: Secondary | ICD-10-CM | POA: Diagnosis not present

## 2017-12-10 DIAGNOSIS — M419 Scoliosis, unspecified: Secondary | ICD-10-CM | POA: Diagnosis not present

## 2017-12-10 DIAGNOSIS — M5136 Other intervertebral disc degeneration, lumbar region: Secondary | ICD-10-CM | POA: Diagnosis not present

## 2017-12-10 DIAGNOSIS — M5441 Lumbago with sciatica, right side: Secondary | ICD-10-CM | POA: Diagnosis not present

## 2017-12-10 MED ORDER — DULOXETINE HCL 60 MG PO CPEP: 60.0000 mg | ORAL_CAPSULE | Freq: Every day | ORAL | 0 refills | Status: DC

## 2017-12-25 ENCOUNTER — Ambulatory Visit: Payer: Medicaid Other | Admitting: Nurse Practitioner

## 2018-01-21 DIAGNOSIS — M5416 Radiculopathy, lumbar region: Secondary | ICD-10-CM | POA: Diagnosis not present

## 2018-01-21 DIAGNOSIS — M5136 Other intervertebral disc degeneration, lumbar region: Secondary | ICD-10-CM | POA: Diagnosis not present

## 2018-02-19 DIAGNOSIS — M5136 Other intervertebral disc degeneration, lumbar region: Secondary | ICD-10-CM | POA: Diagnosis not present

## 2018-02-19 DIAGNOSIS — M5416 Radiculopathy, lumbar region: Secondary | ICD-10-CM | POA: Diagnosis not present

## 2018-02-28 ENCOUNTER — Ambulatory Visit: Payer: Medicaid Other | Admitting: Nurse Practitioner

## 2018-03-11 ENCOUNTER — Ambulatory Visit: Payer: Medicaid Other | Admitting: Nurse Practitioner

## 2018-03-11 ENCOUNTER — Encounter: Payer: Self-pay | Admitting: Nurse Practitioner

## 2018-03-11 ENCOUNTER — Other Ambulatory Visit: Payer: Self-pay

## 2018-03-11 DIAGNOSIS — F32A Depression, unspecified: Secondary | ICD-10-CM

## 2018-03-11 DIAGNOSIS — F419 Anxiety disorder, unspecified: Secondary | ICD-10-CM

## 2018-03-11 DIAGNOSIS — F329 Major depressive disorder, single episode, unspecified: Secondary | ICD-10-CM

## 2018-03-11 DIAGNOSIS — M797 Fibromyalgia: Secondary | ICD-10-CM | POA: Diagnosis not present

## 2018-03-11 MED ORDER — CLONAZEPAM 0.5 MG PO TABS: 0.5000 mg | ORAL_TABLET | Freq: Every day | ORAL | 2 refills | Status: DC | PRN

## 2018-03-11 NOTE — Assessment & Plan Note (Signed)
Chronic, ongoing.  Continue current medication regimen.   

## 2018-03-11 NOTE — Assessment & Plan Note (Signed)
Chronic, ongoing.  Continue current medication regimen.  Continue collaboration visits with [facility].

## 2018-03-11 NOTE — Assessment & Plan Note (Signed)
Chronic, ongoing.  Continue current medication regimen.  Refill on Klonopin sent.

## 2018-03-11 NOTE — Progress Notes (Addendum)
BP 97/68   Pulse 93   Temp 98.6 F (37 C) (Oral)   Ht 5\' 7"  (1.702 m)   Wt 113 lb (51.3 kg)   SpO2 98%   BMI 17.70 kg/m    Subjective:    Patient ID: Wanda Blanchard, female    DOB: 03/25/1975, 43 y.o.   MRN: [id number]  HPI: Wanda Blanchard is a 43 y.o. female  Chief Complaint  Patient presents with  . Anxiety    f/u  . Pain  . Medication Refill    klonopin, cymbalta   FIBROMYALGIA: Initial diagnosis in 2012.  Is able to function on a daily basis with Tramadol, although reports she has not been using it as often due to concerns for addiction.  On review of database, she has not refilled since October.  Also uses Cymbalta in morning, which helps her through daytime hours.  At night she occasionally uses Tramadol.  Does not take Klonopin and Tramadol at the same time.  Uses Tramadol 1-2  times during the week.  [facility] and receiving injections. Has been going to chiropractor since August for adjustments, has relief for three hours than pain returns.   She reports she does not need refills on Tramadol today, as she "has lots left".  ANXIETY/STRESS Duration:controlled Anxious mood: yes  Excessive worrying: yes Irritability: no  Sweating: no Nausea: no Palpitations:no Hyperventilation: no Panic attacks: no Agoraphobia: no  Obscessions/compulsions: no Depressed mood: no [facility] 2/[facility] 03/11/2018 11/28/2017 09/26/2017 05/09/2017 11/13/2016  Decreased Interest 1 2 1 2 2   Down, Depressed, Hopeless 1 1 1 2 2   PHQ - 2 Score 2 3 2 4 4   Altered sleeping 2 2 1 2 2   Tired, decreased energy 2 2 1 2 3   Change in appetite 1 1 3 1 3   Feeling bad or failure about yourself  1 1 0 1 0  Trouble concentrating 0 0 0 1 2  Moving slowly or fidgety/restless 0 1 0 0 1  Suicidal thoughts 0 0 0 0 0  PHQ-9 Score 8 10 7 11 15   Difficult doing work/chores Not difficult at all - - - Very difficult   Anhedonia: no Weight changes: no Insomnia: yes hard to fall  asleep  Hypersomnia: no Fatigue/loss of energy: no Feelings of worthlessness: no Feelings of guilt: no Impaired concentration/indecisiveness: no Suicidal ideations: no  Crying spells: no Recent Stressors/Life Changes: yes   Relationship problems: no   Family stress: yes, family asking her for everything   Financial stress: no    Job stress: no    Recent death/loss: no  GAD 7 : Generalized Anxiety Score 03/11/2018 11/28/2017 09/26/2017  Nervous, Anxious, on Edge 2 1 1   Control/stop worrying 1 1 1   Worry too much - different things 2 2 1   Trouble relaxing 2 2 1   Restless 1 0 0  Easily annoyed or irritable 2 1 2   Afraid - awful might happen 0 0 0  Total GAD 7 Score 10 7 6   Anxiety Difficulty Somewhat difficult - -     Relevant past medical, surgical, family and social history reviewed and updated as indicated. Interim medical history since our last visit reviewed. Allergies and medications reviewed and updated.  Review of Systems  Constitutional: Negative for activity change, appetite change, diaphoresis, fatigue and fever.  Respiratory: Negative for cough, chest tightness and shortness of breath.   Cardiovascular: Negative for chest pain, palpitations and leg swelling.  Gastrointestinal:  Negative for abdominal distention, abdominal pain, constipation, diarrhea, nausea and vomiting.  Endocrine: Negative for cold intolerance, heat intolerance, polydipsia, polyphagia and polyuria.  Neurological: Negative for dizziness, syncope, weakness, light-headedness, numbness and headaches.  Psychiatric/Behavioral: Positive for decreased concentration and sleep disturbance. Negative for self-injury and suicidal ideas. The patient is nervous/anxious.     Per HPI unless specifically indicated above     Objective:    BP 97/68   Pulse 93   Temp 98.6 F (37 C) (Oral)   Ht 5\' 7"  (1.702 m)   Wt 113 lb (51.3 kg)   SpO2 98%   BMI 17.70 kg/m   Wt Readings from Last 3 Encounters:  03/11/18  113 lb (51.3 kg)  11/28/17 117 lb (53.1 kg)  09/26/17 111 lb (50.3 kg)    Physical Exam Vitals signs and nursing note reviewed.  Constitutional:      General: She is awake.     Appearance: She is well-developed.  HENT:     Head: Normocephalic.     Right Ear: Hearing normal.     Left Ear: Hearing normal.     Nose: Nose normal.     Mouth/Throat:     Mouth: Mucous membranes are moist.  Eyes:     General: Lids are normal.        Right eye: No discharge.        Left eye: No discharge.     Conjunctiva/sclera: Conjunctivae normal.     Pupils: Pupils are equal, round, and reactive to light.  Neck:     Musculoskeletal: Normal range of motion and neck supple.     Thyroid: No thyromegaly.     Vascular: No carotid bruit or JVD.  Cardiovascular:     Rate and Rhythm: Normal rate and regular rhythm.     Heart sounds: Normal heart sounds. No murmur. No gallop.   Pulmonary:     Effort: Pulmonary effort is normal.     Breath sounds: Normal breath sounds.  Abdominal:     General: Bowel sounds are normal.     Palpations: Abdomen is soft. There is no hepatomegaly or splenomegaly.  Musculoskeletal:     Right lower leg: No edema.     Left lower leg: No edema.  Lymphadenopathy:     Cervical: No cervical adenopathy.  Skin:    General: Skin is warm and dry.  Neurological:     Mental Status: She is alert and oriented to person, place, and time.  Psychiatric:        Attention and Perception: Attention normal.        Mood and Affect: Mood normal.        Behavior: Behavior normal. Behavior is cooperative.        Thought Content: Thought content normal.        Judgment: Judgment normal.     Results for orders placed or performed in visit on 05/09/17  Rheumatoid Arthritis Profile  Result Value Ref Range   Rhuematoid fact SerPl-aCnc <10.0 0.0 - 13.9 IU/mL   Cyclic Citrullin Peptide Ab 4 0 - 19 units  ANA w/Reflex  Result Value Ref Range   Anti Nuclear Antibody(ANA) Negative Negative        Assessment & Plan:   Problem List Items Addressed This Visit      Other   Depression    Chronic, ongoing.  Continue current medication regimen.        Anxiety    Chronic, ongoing.  Continue current medication regimen.  Refill on Klonopin sent.      Fibromyalgia    Chronic, ongoing.  Continue current medication regimen.  [facility]         Controlled substance.  Checked data base and no other refills of controlled substances noted since December.  Follow up plan: Return in about 3 months (around 06/09/2018) for Anxiety (need UDS and contract).

## 2018-03-11 NOTE — Patient Instructions (Signed)
Clonazepam tablets  What is this medicine?  CLONAZEPAM (kloe NA ze pam) is a benzodiazepine. It is used to treat certain types of seizures. It is also used to treat panic disorder.  This medicine may be used for other purposes; ask your health care provider or pharmacist if you have questions.  COMMON BRAND NAME(S): Ceberclon, Klonopin  What should I tell my health care provider before I take this medicine?  They need to know if you have any of these conditions:  -an alcohol or drug abuse problem  -bipolar disorder, depression, psychosis or other mental health condition  -glaucoma  -kidney or liver disease  -lung or breathing disease  -myasthenia gravis  -Parkinson's disease  -porphyria  -seizures or a history of seizures  -suicidal thoughts  -an unusual or allergic reaction to clonazepam, other benzodiazepines, foods, dyes, or preservatives  -pregnant or trying to get pregnant  -breast-feeding  How should I use this medicine?  Take this medicine by mouth with a glass of water. Follow the directions on the prescription label. If it upsets your stomach, take it with food or milk. Take your medicine at regular intervals. Do not take it more often than directed. Do not stop taking or change the dose except on the advice of your doctor or health care professional.  A special MedGuide will be given to you by the pharmacist with each prescription and refill. Be sure to read this information carefully each time.  Talk to your pediatrician regarding the use of this medicine in children. Special care may be needed.  Overdosage: If you think you have taken too much of this medicine contact a poison control center or emergency room at once.  NOTE: This medicine is only for you. Do not share this medicine with others.  What if I miss a dose?  If you miss a dose, take it as soon as you can. If it is almost time for your next dose, take only that dose. Do not take double or extra doses.  What may interact with this medicine?  Do  not take this medication with any of the following medicines:  -narcotic medicines for cough  -sodium oxybate  This medicine may also interact with the following medications:  -alcohol  -antihistamines for allergy, cough and cold  -antiviral medicines for HIV or AIDS  -certain medicines for anxiety or sleep  -certain medicines for depression, like amitriptyline, fluoxetine, sertraline  -certain medicines for fungal infections like ketoconazole and itraconazole  -certain medicines for seizures like carbamazepine, phenobarbital, phenytoin, primidone  -general anesthetics like halothane, isoflurane, methoxyflurane, propofol  -local anesthetics like lidocaine, pramoxine, tetracaine  -medicines that relax muscles for surgery  -narcotic medicines for pain  -phenothiazines like chlorpromazine, mesoridazine, prochlorperazine, thioridazine  This list may not describe all possible interactions. Give your health care provider a list of all the medicines, herbs, non-prescription drugs, or dietary supplements you use. Also tell them if you smoke, drink alcohol, or use illegal drugs. Some items may interact with your medicine.  What should I watch for while using this medicine?  Tell your doctor or health care professional if your symptoms do not start to get better or if they get worse.  Do not stop taking except on your doctor's advice. You may develop a severe reaction. Your doctor will tell you how much medicine to take.  You may get drowsy or dizzy. Do not drive, use machinery, or do anything that needs mental alertness until you know how this medicine   affects you. To reduce the risk of dizzy and fainting spells, do not stand or sit up quickly, especially if you are an older patient. Alcohol may increase dizziness and drowsiness. Avoid alcoholic drinks.  If you are taking another medicine that also causes drowsiness, you may have more side effects. Give your health care provider a list of all medicines you use. Your doctor  will tell you how much medicine to take. Do not take more medicine than directed. Call emergency for help if you have problems breathing or unusual sleepiness.  The use of this medicine may increase the chance of suicidal thoughts or actions. Pay special attention to how you are responding while on this medicine. Any worsening of mood, or thoughts of suicide or dying should be reported to your health care professional right away.  What side effects may I notice from receiving this medicine?  Side effects that you should report to your doctor or health care professional as soon as possible:  -allergic reactions like skin rash, itching or hives, swelling of the face, lips, or tongue  -breathing problems  -confusion  -loss of balance or coordination  -signs and symptoms of low blood pressure like dizziness; feeling faint or lightheaded, falls; unusually weak or tired  -suicidal thoughts or mood changes  Side effects that usually do not require medical attention (report to your doctor or health care professional if they continue or are bothersome):  -dizziness  -headache  -tiredness  -upset stomach  This list may not describe all possible side effects. Call your doctor for medical advice about side effects. You may report side effects to FDA at 1-800-FDA-1088.  Where should I keep my medicine?  Keep out of the reach of children. This medicine can be abused. Keep your medicine in a safe place to protect it from theft. Do not share this medicine with anyone. Selling or giving away this medicine is dangerous and against the law.  This medicine may cause accidental overdose and death if taken by other adults, children, or pets. Mix any unused medicine with a substance like cat litter or coffee grounds. Then throw the medicine away in a sealed container like a sealed bag or a coffee can with a lid. Do not use the medicine after the expiration date.  Store at room temperature between 15 and 30 degrees C (59 and 86 degrees F).  Protect from light. Keep container tightly closed.  NOTE: This sheet is a summary. It may not cover all possible information. If you have questions about this medicine, talk to your doctor, pharmacist, or health care provider.   2019 Elsevier/Gold Standard (2015-07-02 18:46:32)

## 2018-04-13 ENCOUNTER — Other Ambulatory Visit: Payer: Self-pay | Admitting: Nurse Practitioner

## 2018-04-15 NOTE — Telephone Encounter (Signed)
Requested Prescriptions  Pending Prescriptions Disp Refills  . DULoxetine (CYMBALTA) 60 MG capsule [Pharmacy Med Name: DULOXETINE HCL DR 60 MG CAP] 90 capsule 0    Sig: TAKE 1 CAPSULE BY MOUTH EVERY DAY     Psychiatry: Antidepressants - SNRI Passed - 04/13/2018  3:31 PM      Passed - Completed PHQ-2 or PHQ-9 in the last 360 days.      Passed - Last BP in normal range    BP Readings from Last 1 Encounters:  03/11/18 97/68         Passed - Valid encounter within last 6 months    Recent Outpatient Visits          1 month ago Fibromyalgia   [facility] Ogema, Dorie Rank, NP   4 months ago Anxiety   [facility] Hinckley, [place] T, NP   6 months ago Fibromyalgia   [facility] Osvaldo Angst M, PA-C   9 months ago Controlled substance agreement signed   [facility] Gabriel Cirri, NP   11 months ago Trochanteric bursitis of right hip   [facility] Gabriel Cirri, NP      Future Appointments            In 1 month Cannady, Dorie Rank, NP [employer] [facility]

## 2018-06-10 ENCOUNTER — Telehealth: Payer: Self-pay | Admitting: Nurse Practitioner

## 2018-06-10 NOTE — Telephone Encounter (Signed)
Called pt to set up virtual appt for tomorrow, no answer, left voicemail. °

## 2018-06-11 ENCOUNTER — Encounter: Payer: Self-pay | Admitting: Nurse Practitioner

## 2018-06-11 ENCOUNTER — Other Ambulatory Visit: Payer: Self-pay

## 2018-06-11 ENCOUNTER — Ambulatory Visit (INDEPENDENT_AMBULATORY_CARE_PROVIDER_SITE_OTHER): Payer: Medicaid Other | Admitting: Nurse Practitioner

## 2018-06-11 VITALS — Ht 68.0 in

## 2018-06-11 DIAGNOSIS — F32A Depression, unspecified: Secondary | ICD-10-CM

## 2018-06-11 DIAGNOSIS — F419 Anxiety disorder, unspecified: Secondary | ICD-10-CM | POA: Diagnosis not present

## 2018-06-11 DIAGNOSIS — F329 Major depressive disorder, single episode, unspecified: Secondary | ICD-10-CM | POA: Diagnosis not present

## 2018-06-11 MED ORDER — SUMATRIPTAN SUCCINATE 50 MG PO TABS: 50.0000 mg | ORAL_TABLET | ORAL | 5 refills | Status: DC | PRN

## 2018-06-11 MED ORDER — CLONAZEPAM 0.5 MG PO TABS: 0.5000 mg | ORAL_TABLET | Freq: Every day | ORAL | 2 refills | Status: DC | PRN

## 2018-06-11 MED ORDER — CYCLOBENZAPRINE HCL 10 MG PO TABS: 10.0000 mg | ORAL_TABLET | Freq: Every day | ORAL | 3 refills | Status: DC

## 2018-06-11 MED ORDER — DULOXETINE HCL 30 MG PO CPEP: 30.0000 mg | ORAL_CAPSULE | Freq: Every day | ORAL | 0 refills | Status: DC

## 2018-06-11 NOTE — Patient Instructions (Signed)

## 2018-06-11 NOTE — Progress Notes (Signed)
Ht  (1.727 m)    BMI 17.18 kg/m    Subjective:    Patient ID: Wanda Blanchard, female    DOB: [birth year], 43 y.o.   MRN: [id number]  HPI: Wanda Blanchard is a 43 y.o. female  Chief Complaint  Patient presents with   Anxiety    30m f/u   Depression     This visit was completed via WebEx due to the restrictions of the COVID-19 pandemic. All issues as above were discussed and addressed. Physical exam was done as above through visual confirmation on WebEx. If it was felt that the patient should be evaluated in the office, they were directed there. The patient verbally consented to this visit.  Location of the patient: home  Location of the provider: home  Those involved with this call:   Provider: Aura Dials, DNP  CMA: Elton Sin, CMA  Front Desk/Registration: Landis Martins   Time spent on call: 15 minutes with patient face to face via video conference. More than 50% of this time was spent in counseling and coordination of care. 10 minutes total spent in review of patient's record and preparation of their chart. I verified patient identity using two factors (patient name and date of birth). Patient consents verbally to being seen via telemedicine visit today.   ANXIETY/DEPRESSION Continues on Klonopin and Cymbalta + also takes Tramadol as needed for Fibromyalgia.  Has been on this regimen for long while.  Does not take Tramadol and Klonopin at the same time.  Continues to report she is not using Tramadol as often due to concerns for addiction, uses 1-2 times a week.  On review of database last Tramadol refill 04/28/2018 (28 tablets) and last Klonopin refill 04/12/2018 (30 tablets).  Reports Cymbalta has affected sex life, with less drive, and she feels the Cymbalta is not working well.  She also endorses this time of year is difficult for her the the COVID pandemic.  Is interested in tapering off Cymbalta and "cleansing self" for a little bit and then trying Prozac again.   She denies suicidal ideation, states she at times thinks about "things" but would not do anything because she has "such a strong support system".   She has never had psychiatric evaluation, but is interested in this as she wonders "at times if I have some Bipolar because my moods fluctuate so much".  She is also interested in talk therapy, which she has never done. Duration:stable Anxious mood: yes  Excessive worrying: no  Irritability: no  Sweating: no Nausea: no Palpitations:no Hyperventilation: no Panic attacks: no Agoraphobia: no  Obscessions/compulsions: no Depressed mood: yes Depression screen [facility] 2/9 06/11/2018 03/11/2018 11/28/2017 09/26/2017 05/09/2017  Decreased Interest Down, Depressed, Hopeless PHQ - 2 Score Altered sleeping Tired, decreased energy Change in appetite Feeling bad or failure about yourself  0 1  Trouble concentrating 1 0 0 0 1  Moving slowly or fidgety/restless 1 0 1 0 0  Suicidal thoughts 1 0 0 0 0  PHQ-9 Score Difficult doing work/chores Very difficult Not difficult at all - - -  Some recent data might be hidden   Anhedonia: no Weight changes: no Insomnia: yes hard to fall asleep  Hypersomnia: no Fatigue/loss of  energy: yes Feelings of worthlessness: no Feelings of guilt: yes Impaired concentration/indecisiveness: no Suicidal ideations: no  Crying spells: yes Recent Stressors/Life Changes: no   Relationship problems: no   Family stress: no     Financial stress: no    Job stress: no    Recent death/loss: no  GAD 7 : Generalized Anxiety Score 06/11/2018 03/11/2018 11/28/2017 09/26/2017  Nervous, Anxious, on Edge 1 2 1 1   Control/stop worrying 1 1 1 1   Worry too much - different things 2 2 2 1   Trouble relaxing 3 2 2 1   Restless 2 1 0 0  Easily annoyed or irritable 2 2 1 2   Afraid - awful might happen 2 0 0 0  Total GAD 7 Score 13 10 7 6   Anxiety Difficulty -  Somewhat difficult - -   Relevant past medical, surgical, family and social history reviewed and updated as indicated. Interim medical history since our last visit reviewed. Allergies and medications reviewed and updated.  Review of Systems  Constitutional: Negative for activity change, appetite change, diaphoresis, fatigue and fever.  Respiratory: Negative for cough, chest tightness and shortness of breath.   Cardiovascular: Negative for chest pain, palpitations and leg swelling.  Gastrointestinal: Negative for abdominal distention, abdominal pain, constipation, diarrhea, nausea and vomiting.  Neurological: Negative for dizziness, syncope, weakness, light-headedness, numbness and headaches.  Psychiatric/Behavioral: Negative.     Per HPI unless specifically indicated above     Objective:    Ht 5\' 8"  (1.727 m)    BMI 17.18 kg/m   Wt Readings from Last 3 Encounters:  03/11/18 113 lb (51.3 kg)  11/28/17 117 lb (53.1 kg)  09/26/17 111 lb (50.3 kg)    Physical Exam Vitals signs and nursing note reviewed.  Constitutional:      General: She is awake.     Appearance: She is well-developed. She is not ill-appearing.  HENT:     Head: Normocephalic.     Right Ear: Hearing normal.     Left Ear: Hearing normal.     Nose: Nose normal.  Eyes:     General: Lids are normal.        Right eye: No discharge.        Left eye: No discharge.     Conjunctiva/sclera: Conjunctivae normal.  Neck:     Musculoskeletal: Normal range of motion.  Cardiovascular:     Comments: Unable to auscultate due to virtual visit only Pulmonary:     Effort: Pulmonary effort is normal. No accessory muscle usage or respiratory distress.     Comments: Unable to auscultate due to virtual visit only Neurological:     Mental Status: She is alert and oriented to person, place, and time.  Psychiatric:        Attention and Perception: Attention normal.        Mood and Affect: Mood normal.        Behavior: Behavior  normal. Behavior is cooperative.        Thought Content: Thought content normal.        Judgment: Judgment normal.     Results for orders placed or performed in visit on 05/09/17  Rheumatoid Arthritis Profile  Result Value Ref Range   Rhuematoid fact SerPl-aCnc <10.0 0.0 - 13.9 IU/mL   Cyclic Citrullin Peptide Ab 4 0 - 19 units  ANA w/Reflex  Result Value Ref Range   Anti Nuclear Antibody(ANA) Negative Negative      Assessment & Plan:   Problem List  Items Addressed This Visit      Other   Depression - Primary    Chronic, ongoing.  Denies SI/HI.  Wishes to reduce to discontinue Cymbalta at this time.  Educated on reduction method and she wrote instructions down.  Will reduce to taper off and consider addition of Prozac in 4 weeks.  Return in 4 weeks for follow-up.  Referral to [facility] in [place] for further psychiatric evaluation and possible therapy sessions.        Relevant Medications   DULoxetine (CYMBALTA) 30 MG capsule   Anxiety    Chronic, ongoing.  Continue current medication regimen.  Klonopin refill sent.  UDS next face to face visit and sign contract.      Relevant Medications   DULoxetine (CYMBALTA) 30 MG capsule   Other Relevant Orders   Ambulatory referral to Psychiatry      Controlled substance.  Checked data base and no other refills of controlled substances noted.  I discussed the assessment and treatment plan with the patient. The patient was provided an opportunity to ask questions and all were answered. The patient agreed with the plan and demonstrated an understanding of the instructions.   The patient was advised to call back or seek an in-person evaluation if the symptoms worsen or if the condition fails to improve as anticipated.   I provided 15 minutes of time during this encounter.  Follow up plan: Return in about 4 weeks (around 07/09/2018) for Mood follow-up.

## 2018-06-11 NOTE — Assessment & Plan Note (Signed)
Chronic, ongoing.  Denies SI/HI.  Wishes to reduce to discontinue Cymbalta at this time.  Educated on reduction method and she wrote instructions down.  Will reduce to taper off and consider addition of Prozac in 4 weeks.  Return in 4 weeks for follow-up.  Referral to [facility] in [place] for further psychiatric evaluation and possible therapy sessions.

## 2018-06-11 NOTE — Assessment & Plan Note (Signed)
Chronic, ongoing.  Continue current medication regimen.  Klonopin refill sent.  UDS next face to face visit and sign contract.

## 2018-07-15 ENCOUNTER — Ambulatory Visit (INDEPENDENT_AMBULATORY_CARE_PROVIDER_SITE_OTHER): Payer: Medicaid Other | Admitting: Nurse Practitioner

## 2018-07-15 ENCOUNTER — Other Ambulatory Visit: Payer: Self-pay

## 2018-07-15 ENCOUNTER — Encounter: Payer: Self-pay | Admitting: Nurse Practitioner

## 2018-07-15 VITALS — BP 111/75 | HR 76 | Temp 98.6°F | Ht 68.0 in | Wt 115.0 lb

## 2018-07-15 DIAGNOSIS — F329 Major depressive disorder, single episode, unspecified: Secondary | ICD-10-CM

## 2018-07-15 DIAGNOSIS — F419 Anxiety disorder, unspecified: Secondary | ICD-10-CM

## 2018-07-15 DIAGNOSIS — F32A Depression, unspecified: Secondary | ICD-10-CM

## 2018-07-15 MED ORDER — FLUOXETINE HCL 40 MG PO CAPS: 40.0000 mg | ORAL_CAPSULE | Freq: Every day | ORAL | 3 refills | Status: DC

## 2018-07-15 NOTE — Progress Notes (Signed)
BP 111/75   Pulse 76   Temp 98.6 F (37 C) (Oral)   Ht 5\' 8"  (1.727 m)   Wt 115 lb (52.2 kg)   SpO2 99%   BMI 17.49 kg/m    Subjective:    Patient ID: Wanda Blanchard, female    DOB: 1975/07/05, 43 y.o.   MRN: [id number]  HPI: Wanda Blanchard is a 43 y.o. female  Chief Complaint  Patient presents with  . Anxiety    4w f/u  . Depression   ANXIETY/STRESS & DEPRESSION Continues on Klonopin and Cymbalta.  At last visit she reported affect of Cymbalta on sex life being a major stressor for her.  Less sexual drive.  Was interested in reducing Cymbalta and then trying Prozac which has worked well for her mood in past.  She understands Cymbalta is good for pain, but reports "if my mind is not well then my body can not be either".  Also was interested in psychiatric evaluation + talk therapy, which she has never had.  This referral was placed last visit and has been authorized on review.  She sees psychiatry on June 25th for further testing and recommendations.  She endorses becoming easily annoyed and depressed at time, states she "thought this would be gone" once stressors in life improved.  Years ago did not get testing for mental health as she was going through divorce and did not want testing on records for mental health, now would like to have further evaluation and talk therapy.  Currently taking Cymbalta 30 MG every 4 days.   Duration:stable Anxious mood: yes  Excessive worrying: yes Irritability: yes  Sweating: no Nausea: no Palpitations:no Hyperventilation: no Panic attacks: yes Agoraphobia: no  Obscessions/compulsions: no Depressed mood: yes Depression screen [facility] 2/9 07/15/2018 06/11/2018 03/11/2018 11/28/2017 09/26/2017  Decreased Interest 2 2 1 2 1   Down, Depressed, Hopeless 2 2 1 1 1   PHQ - 2 Score 4 4 2 3 2   Altered sleeping 3 3 2 2 1   Tired, decreased energy 2 3 2 2 1   Change in appetite 3 2 1 1 3   Feeling bad or failure about yourself  1 2 1 1  0  Trouble concentrating  2 1 0 0 0  Moving slowly or fidgety/restless 3 1 0 1 0  Suicidal thoughts 1 1 0 0 0  PHQ-9 Score 19 17 8 10 7   Difficult doing work/chores Very difficult Very difficult Not difficult at all - -  Some recent data might be hidden   Anhedonia: no Weight changes: no Insomnia: yes hard to fall asleep  Hypersomnia: no Fatigue/loss of energy: no Feelings of worthlessness: yes Feelings of guilt: yes Impaired concentration/indecisiveness: yes Suicidal ideations: no  Crying spells: yes Recent Stressors/Life Changes: no   Relationship problems: no   Family stress: no     Financial stress: no    Job stress: no    Recent death/loss: no  GAD 7 : Generalized Anxiety Score 06/11/2018 03/11/2018 11/28/2017 09/26/2017  Nervous, Anxious, on Edge 1 2 1 1   Control/stop worrying 1 1 1 1   Worry too much - different things 2 2 2 1   Trouble relaxing 3 2 2 1   Restless 2 1 0 0  Easily annoyed or irritable 2 2 1 2   Afraid - awful might happen 2 0 0 0  Total GAD 7 Score 13 10 7 6   Anxiety Difficulty - Somewhat difficult - -     Relevant past medical, surgical, family  and social history reviewed and updated as indicated. Interim medical history since our last visit reviewed. Allergies and medications reviewed and updated.  Review of Systems  Constitutional: Negative for activity change, appetite change, diaphoresis, fatigue and fever.  Respiratory: Negative for cough, chest tightness and shortness of breath.   Cardiovascular: Negative for chest pain, palpitations and leg swelling.  Gastrointestinal: Negative for abdominal distention, abdominal pain, constipation, diarrhea, nausea and vomiting.  Neurological: Negative for dizziness, syncope, weakness, light-headedness, numbness and headaches.  Psychiatric/Behavioral: Positive for decreased concentration and sleep disturbance. Negative for self-injury and suicidal ideas. The patient is nervous/anxious.     Per HPI unless specifically indicated above      Objective:    BP 111/75   Pulse 76   Temp 98.6 F (37 C) (Oral)   Ht 5\' 8"  (1.727 m)   Wt 115 lb (52.2 kg)   SpO2 99%   BMI 17.49 kg/m   Wt Readings from Last 3 Encounters:  07/15/18 115 lb (52.2 kg)  03/11/18 113 lb (51.3 kg)  11/28/17 117 lb (53.1 kg)    Physical Exam Vitals signs and nursing note reviewed.  Constitutional:      General: She is awake.     Appearance: She is well-developed.  HENT:     Head: Normocephalic.     Right Ear: Hearing normal.     Left Ear: Hearing normal.     Nose: Nose normal.     Mouth/Throat:     Mouth: Mucous membranes are moist.  Eyes:     General: Lids are normal.        Right eye: No discharge.        Left eye: No discharge.     Conjunctiva/sclera: Conjunctivae normal.     Pupils: Pupils are equal, round, and reactive to light.  Neck:     Musculoskeletal: Normal range of motion and neck supple.     Thyroid: No thyromegaly.     Vascular: No carotid bruit.  Cardiovascular:     Rate and Rhythm: Normal rate and regular rhythm.     Heart sounds: Normal heart sounds. No murmur. No gallop.   Pulmonary:     Effort: Pulmonary effort is normal.     Breath sounds: Normal breath sounds.  Abdominal:     General: Bowel sounds are normal.     Palpations: Abdomen is soft.  Musculoskeletal:     Right lower leg: No edema.     Left lower leg: No edema.  Lymphadenopathy:     Cervical: No cervical adenopathy.  Skin:    General: Skin is warm and dry.  Neurological:     Mental Status: She is alert and oriented to person, place, and time.  Psychiatric:        Attention and Perception: Attention normal.        Mood and Affect: Mood normal.        Behavior: Behavior normal. Behavior is cooperative.        Thought Content: Thought content normal.        Judgment: Judgment normal.     Results for orders placed or performed in visit on 05/09/17  Rheumatoid Arthritis Profile  Result Value Ref Range   Rhuematoid fact SerPl-aCnc <10.0 0.0 -  13.9 IU/mL   Cyclic Citrullin Peptide Ab 4 0 - 19 units  ANA w/Reflex  Result Value Ref Range   Anti Nuclear Antibody(ANA) Negative Negative      Assessment & Plan:   Problem List  Items Addressed This Visit      Other   Depression - Primary    .Chronic, ongoing.  Denies SI/HI.  Will discontinue Cymbalta and start Prozac 40 MG at this time.  Review psychiatry recommendations once available.  Return in 6 weeks.        Relevant Medications   FLUoxetine (PROZAC) 40 MG capsule   Anxiety    Chronic, ongoing.  Continue current medication regimen.  Is aware of risks of Klonopin.  UDS at next visit in August prior to further refills and sign drug contract.  Review psychiatry recommendations once available.      Relevant Medications   FLUoxetine (PROZAC) 40 MG capsule       Follow up plan: Return in about 6 weeks (around 08/26/2018) for Mood follow-up.

## 2018-07-15 NOTE — Patient Instructions (Signed)

## 2018-07-15 NOTE — Assessment & Plan Note (Signed)
Chronic, ongoing.  Continue current medication regimen.  Is aware of risks of Klonopin.  UDS at next visit in August prior to further refills and sign drug contract.  Review psychiatry recommendations once available.

## 2018-07-15 NOTE — Assessment & Plan Note (Signed)
.  Chronic, ongoing.  Denies SI/HI.  Will discontinue Cymbalta and start Prozac 40 MG at this time.  Review psychiatry recommendations once available.  Return in 6 weeks.

## 2018-08-01 DIAGNOSIS — F3132 Bipolar disorder, current episode depressed, moderate: Secondary | ICD-10-CM | POA: Diagnosis not present

## 2018-08-12 DIAGNOSIS — F3132 Bipolar disorder, current episode depressed, moderate: Secondary | ICD-10-CM | POA: Diagnosis not present

## 2018-08-12 DIAGNOSIS — Z1389 Encounter for screening for other disorder: Secondary | ICD-10-CM | POA: Diagnosis not present

## 2018-08-26 ENCOUNTER — Encounter: Payer: Self-pay | Admitting: Nurse Practitioner

## 2018-08-26 ENCOUNTER — Other Ambulatory Visit: Payer: Self-pay

## 2018-08-26 ENCOUNTER — Ambulatory Visit (INDEPENDENT_AMBULATORY_CARE_PROVIDER_SITE_OTHER): Payer: Medicaid Other | Admitting: Nurse Practitioner

## 2018-08-26 VITALS — BP 102/71 | HR 85 | Temp 99.0°F | Ht 68.0 in | Wt 113.0 lb

## 2018-08-26 DIAGNOSIS — F419 Anxiety disorder, unspecified: Secondary | ICD-10-CM

## 2018-08-26 DIAGNOSIS — F319 Bipolar disorder, unspecified: Secondary | ICD-10-CM | POA: Diagnosis not present

## 2018-08-26 DIAGNOSIS — Z79899 Other long term (current) drug therapy: Secondary | ICD-10-CM | POA: Diagnosis not present

## 2018-08-26 NOTE — Progress Notes (Signed)
BP 102/71   Pulse 85   Temp 99 F (37.2 C) (Oral)   Ht 5\' 8"  (1.727 m)   Wt 113 lb (51.3 kg)   SpO2 99%   BMI 17.18 kg/m    Subjective:    Patient ID: Wanda Blanchard, female    DOB: December 04, 1975, 43 y.o.   MRN: [id number]  HPI: Wanda Blanchard is a 43 y.o. female  Chief Complaint  Patient presents with  . Depression  . Anxiety   ANXIETY/DEPRESSION Seen by psychiatry 2 weeks ago and is currently taking Latuda 20 MG daily will go up to tablet in 2 weeks (added by psychiatry), along with Prozac 20 MG and Klonopin 0.5MG  daily as needed.  Sees psychiatry again in 2 weeks for follow-up.  Diagnosed with Bipolar I.  She feels that the [place] is "helping some".  She reports psychiatry's goal is to discontinue Prozac over time.  She states they are going to keep the Klonopin for her anxiety.  She reports good experience with psychiatry team and is feeling hopeful now she has a diagnosis in hand . Reports plans to start therapy in upcoming months. Duration:stable Anxious mood: yes Excessive worrying: no Irritability: no  Sweating: no Nausea: no Palpitations:no Hyperventilation: no Panic attacks: no Agoraphobia: no  Obscessions/compulsions: no Depressed mood: yes Depression screen [facility] 2/9 08/26/2018 07/15/2018 06/11/2018 03/11/2018 11/28/2017  Decreased Interest 1 2 2 1 2   Down, Depressed, Hopeless 2 2 2 1 1   PHQ - 2 Score 3 4 4 2 3   Altered sleeping 2 3 3 2 2   Tired, decreased energy 1 2 3 2 2   Change in appetite 3 3 2 1 1   Feeling bad or failure about yourself  1 1 2 1 1   Trouble concentrating 1 2 1  0 0  Moving slowly or fidgety/restless 1 3 1  0 1  Suicidal thoughts 1 1 1  0 0  PHQ-9 Score 13 19 17 8 10   Difficult doing work/chores Somewhat difficult Very difficult Very difficult Not difficult at all -  Some recent data might be hidden   Anhedonia: no Weight changes: no Insomnia: yes hard to fall asleep  Hypersomnia: no Fatigue/loss of energy: yes Feelings of worthlessness: no  Feelings of guilt: no Impaired concentration/indecisiveness: yes Suicidal ideations: no  Crying spells: no Recent Stressors/Life Changes: no   Relationship problems: no   Family stress: no     Financial stress: no    Job stress: no    Recent death/loss: no  GAD 7 : Generalized Anxiety Score 08/26/2018 06/11/2018 03/11/2018 11/28/2017  Nervous, Anxious, on Edge 1 1 2 1   Control/stop worrying 1 1 1 1   Worry too much - different things 1 2 2 2   Trouble relaxing 3 3 2 2   Restless 1 2 1  0  Easily annoyed or irritable 3 2 2 1   Afraid - awful might happen 2 2 0 0  Total GAD 7 Score 12 13 10 7   Anxiety Difficulty Somewhat difficult - Somewhat difficult -     Relevant past medical, surgical, family and social history reviewed and updated as indicated. Interim medical history since our last visit reviewed. Allergies and medications reviewed and updated.  Review of Systems  Constitutional: Negative for activity change, appetite change, diaphoresis, fatigue and fever.  Respiratory: Negative for cough, chest tightness and shortness of breath.   Cardiovascular: Negative for chest pain, palpitations and leg swelling.  Gastrointestinal: Negative for abdominal distention, abdominal pain, constipation, diarrhea, nausea and vomiting.  Neurological: Negative for dizziness, syncope, weakness, light-headedness, numbness and headaches.  Psychiatric/Behavioral: Positive for decreased concentration and sleep disturbance. Negative for self-injury and suicidal ideas. The patient is nervous/anxious.     Per HPI unless specifically indicated above     Objective:    BP 102/71   Pulse 85   Temp 99 F (37.2 C) (Oral)   Ht 5\' 8"  (1.727 m)   Wt 113 lb (51.3 kg)   SpO2 99%   BMI 17.18 kg/m   Wt Readings from Last 3 Encounters:  08/26/18 113 lb (51.3 kg)  07/15/18 115 lb (52.2 kg)  03/11/18 113 lb (51.3 kg)    Physical Exam Vitals signs and nursing note reviewed.  Constitutional:      General: She  is awake. She is not in acute distress.    Appearance: She is well-developed. She is not ill-appearing.  HENT:     Head: Normocephalic.     Right Ear: Hearing normal.     Left Ear: Hearing normal.     Nose: Nose normal.     Mouth/Throat:     Mouth: Mucous membranes are moist.  Eyes:     General: Lids are normal.        Right eye: No discharge.        Left eye: No discharge.     Conjunctiva/sclera: Conjunctivae normal.     Pupils: Pupils are equal, round, and reactive to light.  Neck:     Musculoskeletal: Normal range of motion and neck supple.     Thyroid: No thyromegaly.     Vascular: No carotid bruit or JVD.  Cardiovascular:     Rate and Rhythm: Normal rate and regular rhythm.     Heart sounds: Normal heart sounds. No murmur. No gallop.   Pulmonary:     Effort: Pulmonary effort is normal. No accessory muscle usage or respiratory distress.     Breath sounds: Normal breath sounds.  Abdominal:     General: Bowel sounds are normal.     Palpations: Abdomen is soft. There is no hepatomegaly or splenomegaly.  Musculoskeletal:     Right lower leg: No edema.     Left lower leg: No edema.  Lymphadenopathy:     Cervical: No cervical adenopathy.  Skin:    General: Skin is warm and dry.  Neurological:     Mental Status: She is alert and oriented to person, place, and time.  Psychiatric:        Attention and Perception: Attention normal.        Mood and Affect: Mood normal.        Behavior: Behavior normal. Behavior is cooperative.        Thought Content: Thought content normal.        Judgment: Judgment normal.     Results for orders placed or performed in visit on 05/09/17  Rheumatoid Arthritis Profile  Result Value Ref Range   Rhuematoid fact SerPl-aCnc <10.0 0.0 - 13.9 IU/mL   Cyclic Citrullin Peptide Ab 4 0 - 19 units  ANA w/Reflex  Result Value Ref Range   Anti Nuclear Antibody(ANA) Negative Negative      Assessment & Plan:   Problem List Items Addressed This Visit       Other   Bipolar I disorder (HCC) - Primary    Chronic, ongoing.  Denies SI/HI.  Continue current medication regimen, including Latuda, plus continue collaboration with psychiatry.  Return in 3 months for follow-up.  Anxiety    Chronic, ongoing.  Continue current medication regimen, Klonopin, and collaboration with psychiatry.  Discussed with patient that will refill Klonopin in August when due, but from here on out prefer she keep all her psychiatry medications with psychiatry team.  She agrees with this plan.  After August will have psychiatry team refill Klonopin.        Relevant Medications   FLUoxetine (PROZAC) 20 MG capsule   Other Relevant Orders   Urine drugs of abuse scrn w alc, routine (Ref Lab)   Controlled substance agreement signed    Signed at this visit and UDS ordered.          Follow up plan: Return in about 3 months (around 11/26/2018) for Mood and Fibromyalgia.

## 2018-08-26 NOTE — Assessment & Plan Note (Signed)
Chronic, ongoing.  Continue current medication regimen, Klonopin, and collaboration with psychiatry.  Discussed with patient that will refill Klonopin in August when due, but from here on out prefer she keep all her psychiatry medications with psychiatry team.  She agrees with this plan.  After August will have psychiatry team refill Klonopin.

## 2018-08-26 NOTE — Assessment & Plan Note (Signed)
Chronic, ongoing.  Denies SI/HI.  Continue current medication regimen, including Latuda, plus continue collaboration with psychiatry.  Return in 3 months for follow-up.

## 2018-08-26 NOTE — Patient Instructions (Signed)
Bipolar 1 Disorder Bipolar 1 disorder is a mental health disorder in which a person has episodes of emotional highs (mania), and may also have episodes of emotional lows (depression) in addition to highs. Bipolar 1 disorder is different from other bipolar disorders because it involves extreme manic episodes. These episodes last at least one week or involve symptoms that are so severe that hospitalization is needed to keep the person safe. What increases the risk? The cause of this condition is not known. However, certain factors make you more likely to have bipolar disorder, such as:  Having a family member with the disorder.  An imbalance of certain chemicals in the brain (neurotransmitters).  Stress, such as illness, financial problems, or a death.  Certain conditions that affect the brain or spinal cord (neurologic conditions).  Brain injury (trauma).  Having another mental health disorder, such as: ? Obsessive compulsive disorder. ? Schizophrenia. What are the signs or symptoms? Symptoms of mania include:  Very high self-esteem or self-confidence.  Decreased need for sleep.  Unusual talkativeness or feeling a need to keep talking. Speech may be very fast. It may seem like you cannot stop talking.  Racing thoughts or constant talking, with quick shifts between topics that may or may not be related (flight of ideas).  Decreased ability to focus or concentrate.  Increased purposeful activity, such as work, studies, or social activity.  Increased nonproductive activity. This could be pacing, squirming and fidgeting, or finger and toe tapping.  Impulsive behavior and poor judgment. This may result in high-risk activities, such as having unprotected sex or spending a lot of money. Symptoms of depression include:  Feeling sad, hopeless, or helpless.  Frequent or uncontrollable crying.  Lack of feeling or caring about anything.  Sleeping too much.  Moving more slowly than  usual.  Not being able to enjoy things you used to enjoy.  Wanting to be alone all the time.  Feeling guilty or worthless.  Lack of energy or motivation.  Trouble concentrating or remembering.  Trouble making decisions.  Increased appetite.  Thoughts of death, or the desire to harm yourself. Sometimes, you may have a mixed mood. This means having symptoms of depression and mania. Stress can make symptoms worse. How is this diagnosed? To diagnose bipolar disorder, your health care provider may ask about your:  Emotional episodes.  Medical history.  Alcohol and drug use. This includes prescription medicines. Certain medical conditions and substances can cause symptoms that seem like bipolar disorder (secondary bipolar disorder). How is this treated? Bipolar disorder is a long-term (chronic) illness. It is best controlled with ongoing (continuous) treatment rather than treatment only when symptoms occur. Treatment may include:  Medicine. Medicine can be prescribed by a provider who specializes in treating mental disorders (psychiatrist). ? Medicines called mood stabilizers are usually prescribed. ? If symptoms occur even while taking a mood stabilizer, other medicines may be added.  Psychotherapy. Some forms of talk therapy, such as cognitive-behavioral therapy (CBT), can provide support, education, and guidance.  Coping methods, such as journaling or relaxation exercises. These may include: ? Yoga. ? Meditation. ? Deep breathing.  Lifestyle changes, such as: ? Limiting alcohol and drug use. ? Exercising regularly. ? Getting plenty of sleep. ? Making healthy eating choices.  A combination of medicine, talk therapy, and coping methods is best. A procedure in which electricity is applied to the brain through the scalp (electroconvulsive therapy) may be used in cases of severe mania when medicine and psychotherapy work too   slowly or do not work. Follow these instructions at  home: Activity   Return to your normal activities as told by your health care provider.  Find activities that you enjoy, and make time to do them.  Exercise regularly as told by your health care provider. Lifestyle  Limit alcohol intake to no more than 1 drink a day for nonpregnant women and 2 drinks a day for men. One drink equals 12 oz of beer, 5 oz of wine, or 1 oz of hard liquor.  Follow a set schedule for eating and sleeping.  Eat a balanced diet that includes fresh fruits and vegetables, whole grains, low-fat dairy, and lean meat.  Get 7-8 hours of sleep each night. General instructions  Take over-the-counter and prescription medicines only as told by your health care provider.  Think about joining a support group. Your health care provider may be able to recommend a support group.  Talk with your family and loved ones about your treatment goals and how they can help.  Keep all follow-up visits as told by your health care provider. This is important. Where to find more information For more information about bipolar disorder, visit the following websites:  National Alliance on Mental Illness: www.nami.org  U.S. National Institute of Mental Health: www.nimh.nih.gov Contact a health care provider if:  Your symptoms get worse.  You have side effects from your medicine, and they get worse.  You have trouble sleeping.  You have trouble doing daily activities.  You feel unsafe in your surroundings.  You are dealing with substance abuse. Get help right away if:  You have new symptoms.  You have thoughts about harming yourself.  You self-harm. This information is not intended to replace advice given to you by your health care provider. Make sure you discuss any questions you have with your health care provider. Document Released: 05/01/2000 Document Revised: 01/05/2017 Document Reviewed: 09/23/2015 Elsevier Patient Education  2020 Elsevier Inc.  

## 2018-08-26 NOTE — Assessment & Plan Note (Signed)
Signed at this visit and UDS ordered.

## 2018-08-27 LAB — URINE DRUGS OF ABUSE SCREEN W ALC, ROUTINE (REF LAB)
Amphetamines, Urine: NEGATIVE ng/mL
Barbiturate Quant, Ur: NEGATIVE ng/mL
Benzodiazepine Quant, Ur: NEGATIVE ng/mL
Cannabinoid Quant, Ur: NEGATIVE ng/mL
Cocaine (Metab.): NEGATIVE ng/mL
Ethanol, Urine: NEGATIVE %
Methadone Screen, Urine: NEGATIVE ng/mL
Opiate Quant, Ur: NEGATIVE ng/mL
PCP Quant, Ur: NEGATIVE ng/mL
Propoxyphene: NEGATIVE ng/mL

## 2018-09-14 ENCOUNTER — Other Ambulatory Visit: Payer: Self-pay | Admitting: Nurse Practitioner

## 2018-09-14 NOTE — Telephone Encounter (Signed)
Requested medication (s) are due for refill today: yes  Requested medication (s) are on the active medication list: yes  Last refill:  06/11/18  Future visit scheduled: yes  Notes to clinic:  Medication not delegated to NT to refill   Requested Prescriptions  Pending Prescriptions Disp Refills   clonazePAM (KLONOPIN) 0.5 MG tablet [Pharmacy Med Name: CLONAZEPAM 0.5 MG TABLET] 30 tablet 2    Sig: Take 1 tablet (0.5 mg total) by mouth daily as needed.     Not Delegated - Psychiatry:  Anxiolytics/Hypnotics Failed - 09/14/2018 10:13 AM      Failed - This refill cannot be delegated      Failed - Urine Drug Screen completed in last 360 days.      Passed - Valid encounter within last 6 months    Recent Outpatient Visits          2 weeks ago Bipolar I disorder ([place])   [place], [place] T, NP   2 months ago Depression, unspecified depression type   [facility] [place], [place] T, NP   3 months ago Depression, unspecified depression type   [place], Barbaraann Faster, NP   6 months ago [place], Barbaraann Faster, NP   9 months ago [place], Barbaraann Faster, NP      Future Appointments            In 2 months [place], Barbaraann Faster, NP [employer]

## 2018-09-16 DIAGNOSIS — F3132 Bipolar disorder, current episode depressed, moderate: Secondary | ICD-10-CM | POA: Diagnosis not present

## 2018-09-16 NOTE — Telephone Encounter (Signed)
Future refills to be filled by psychiatry

## 2018-10-03 ENCOUNTER — Telehealth: Payer: Self-pay

## 2018-10-03 NOTE — Telephone Encounter (Signed)
She can do this.  Come in for wet prep and then virtual.

## 2018-10-03 NOTE — Telephone Encounter (Signed)
Copied from [place] [phone]. Topic: General - Other >> Oct 03, 2018 12:08 PM Ivar Drape wrote: Reason for CRM:   Patient needs an appt with provider tomorrow for a [facility] Smelly Vaginal Discharge that itches. >> Oct 03, 2018  4:58 PM Don Perking M wrote: No appts in office, can this pt come in and do a self swab then follow up with a virtual?    Routing to provider to advise.

## 2018-10-04 NOTE — Telephone Encounter (Signed)
Called and left patient a VM asking for her to please return my call.  

## 2018-10-07 NOTE — Telephone Encounter (Signed)
Patient is scheduled for tomorrow.

## 2018-10-08 ENCOUNTER — Encounter: Payer: Self-pay | Admitting: Family Medicine

## 2018-10-08 ENCOUNTER — Other Ambulatory Visit: Payer: Self-pay

## 2018-10-08 ENCOUNTER — Ambulatory Visit: Payer: Medicaid Other | Admitting: Family Medicine

## 2018-10-08 VITALS — BP 108/76 | HR 89 | Temp 99.0°F | Ht 68.0 in | Wt 115.0 lb

## 2018-10-08 DIAGNOSIS — N898 Other specified noninflammatory disorders of vagina: Secondary | ICD-10-CM | POA: Diagnosis not present

## 2018-10-08 DIAGNOSIS — B9689 Other specified bacterial agents as the cause of diseases classified elsewhere: Secondary | ICD-10-CM

## 2018-10-08 DIAGNOSIS — N760 Acute vaginitis: Secondary | ICD-10-CM | POA: Diagnosis not present

## 2018-10-08 LAB — WET PREP FOR TRICH, YEAST, CLUE
Clue Cell Exam: POSITIVE — AB
Trichomonas Exam: NEGATIVE
Yeast Exam: NEGATIVE

## 2018-10-08 MED ORDER — METRONIDAZOLE 500 MG PO TABS: 500.0000 mg | ORAL_TABLET | Freq: Two times a day (BID) | ORAL | 0 refills | Status: DC

## 2018-10-08 NOTE — Progress Notes (Signed)
BP 108/76   Pulse 89   Temp 99 F (37.2 C) (Oral)   Ht 5\' 8"  (1.727 m)   Wt 115 lb (52.2 kg)   SpO2 99%   BMI 17.49 kg/m    Subjective:    Patient ID: Wanda ShellingAnna M Blanchard, female    DOB: 05-28-1975, 43 y.o.   MRN: [id number]  HPI: Wanda Shellingnna M Blanchard is a 43 y.o. female  Chief Complaint  Patient presents with  . Vaginal Discharge    x over a month. thick and strong odor. tried OTC cream   VAGINAL DISCHARGE Duration: month, maybe a month and a half Discharge description: white  Pruritus: yes Dysuria: no Malodorous: yes Urinary frequency: no Fevers: no Abdominal pain: no  Sexual activity: monogamous History of sexually transmitted diseases: no Recent antibiotic use: no Context: about a month  Treatments attempted: antifungal  Relevant past medical, surgical, family and social history reviewed and updated as indicated. Interim medical history since our last visit reviewed. Allergies and medications reviewed and updated.  Review of Systems  Constitutional: Negative.   Respiratory: Negative.   Cardiovascular: Negative.   Genitourinary: Positive for vaginal discharge. Negative for decreased urine volume, difficulty urinating, dyspareunia, dysuria, enuresis, flank pain, frequency, genital sores, hematuria, menstrual problem, pelvic pain, urgency, vaginal bleeding and vaginal pain.  Psychiatric/Behavioral: Negative.     Per HPI unless specifically indicated above     Objective:    BP 108/76   Pulse 89   Temp 99 F (37.2 C) (Oral)   Ht 5\' 8"  (1.727 m)   Wt 115 lb (52.2 kg)   SpO2 99%   BMI 17.49 kg/m   Wt Readings from Last 3 Encounters:  10/08/18 115 lb (52.2 kg)  08/26/18 113 lb (51.3 kg)  07/15/18 115 lb (52.2 kg)    Physical Exam Vitals signs and nursing note reviewed.  Constitutional:      General: She is not in acute distress.    Appearance: Normal appearance. She is not ill-appearing, toxic-appearing or diaphoretic.  HENT:     Head: Normocephalic and  atraumatic.     Right Ear: External ear normal.     Left Ear: External ear normal.     Nose: Nose normal.     Mouth/Throat:     Mouth: Mucous membranes are moist.     Pharynx: Oropharynx is clear.  Eyes:     General: No scleral icterus.       Right eye: No discharge.        Left eye: No discharge.     Extraocular Movements: Extraocular movements intact.     Conjunctiva/sclera: Conjunctivae normal.     Pupils: Pupils are equal, round, and reactive to light.  Neck:     Musculoskeletal: Normal range of motion and neck supple.  Cardiovascular:     Rate and Rhythm: Normal rate and regular rhythm.     Pulses: Normal pulses.     Heart sounds: Normal heart sounds. No murmur. No friction rub. No gallop.   Pulmonary:     Effort: Pulmonary effort is normal. No respiratory distress.     Breath sounds: Normal breath sounds. No stridor. No wheezing, rhonchi or rales.  Chest:     Chest wall: No tenderness.  Musculoskeletal: Normal range of motion.  Skin:    General: Skin is warm and dry.     Capillary Refill: Capillary refill takes less than 2 seconds.     Coloration: Skin is not jaundiced or pale.  Findings: No bruising, erythema, lesion or rash.  Neurological:     General: No focal deficit present.     Mental Status: She is alert and oriented to person, place, and time. Mental status is at baseline.  Psychiatric:        Mood and Affect: Mood normal.        Behavior: Behavior normal.        Thought Content: Thought content normal.        Judgment: Judgment normal.     Results for orders placed or performed in visit on 08/26/18  Urine drugs of abuse scrn w alc, routine (Ref Lab)  Result Value Ref Range   Amphetamines, Urine Negative Cutoff=1000 ng/mL   Barbiturate Quant, Ur Negative Cutoff=300 ng/mL   Benzodiazepine Quant, Ur Negative Cutoff=300 ng/mL   Cannabinoid Quant, Ur Negative Cutoff=50 ng/mL   Cocaine (Metab.) Negative Cutoff=300 ng/mL   Opiate Quant, Ur Negative  Cutoff=300 ng/mL   PCP Quant, Ur Negative Cutoff=25 ng/mL   Methadone Screen, Urine Negative Cutoff=300 ng/mL   Propoxyphene Negative Cutoff=300 ng/mL   Ethanol, Urine Negative Cutoff=0.020 %      Assessment & Plan:   Problem List Items Addressed This Visit    None    Visit Diagnoses    BV (bacterial vaginosis)    -  Primary   Will treat with flagyl. Call if not getting better or getting worse. Call with any concerns.    Relevant Medications   metroNIDAZOLE (FLAGYL) 500 MG tablet   Vaginal discharge       + clue cells   Relevant Orders   WET PREP FOR [place], YEAST, CLUE       Follow up plan: Return if symptoms worsen or fail to improve.

## 2018-10-08 NOTE — Patient Instructions (Signed)
Bacterial Vaginosis  Bacterial vaginosis is a vaginal infection that occurs when the normal balance of bacteria in the vagina is disrupted. It results from an overgrowth of certain bacteria. This is the most common vaginal infection among women ages 15-44. Because bacterial vaginosis increases your risk for STIs (sexually transmitted infections), getting treated can help reduce your risk for chlamydia, gonorrhea, herpes, and HIV (human immunodeficiency virus). Treatment is also important for preventing complications in pregnant women, because this condition can cause an early (premature) delivery. What are the causes? This condition is caused by an increase in harmful bacteria that are normally present in small amounts in the vagina. However, the reason that the condition develops is not fully understood. What increases the risk? The following factors may make you more likely to develop this condition:  Having a new sexual partner or multiple sexual partners.  Having unprotected sex.  Douching.  Having an intrauterine device (IUD).  Smoking.  Drug and alcohol abuse.  Taking certain antibiotic medicines.  Being pregnant. You cannot get bacterial vaginosis from toilet seats, bedding, swimming pools, or contact with objects around you. What are the signs or symptoms? Symptoms of this condition include:  Grey or white vaginal discharge. The discharge can also be watery or foamy.  A fish-like odor with discharge, especially after sexual intercourse or during menstruation.  Itching in and around the vagina.  Burning or pain with urination. Some women with bacterial vaginosis have no signs or symptoms. How is this diagnosed? This condition is diagnosed based on:  Your medical history.  A physical exam of the vagina.  Testing a sample of vaginal fluid under a microscope to look for a large amount of bad bacteria or abnormal cells. Your health care provider may use a cotton swab or  a small wooden spatula to collect the sample. How is this treated? This condition is treated with antibiotics. These may be given as a pill, a vaginal cream, or a medicine that is put into the vagina (suppository). If the condition comes back after treatment, a second round of antibiotics may be needed. Follow these instructions at home: Medicines  Take over-the-counter and prescription medicines only as told by your health care provider.  Take or use your antibiotic as told by your health care provider. Do not stop taking or using the antibiotic even if you start to feel better. General instructions  If you have a female sexual partner, tell her that you have a vaginal infection. She should see her health care provider and be treated if she has symptoms. If you have a female sexual partner, he does not need treatment.  During treatment: ? Avoid sexual activity until you finish treatment. ? Do not douche. ? Avoid alcohol as directed by your health care provider. ? Avoid breastfeeding as directed by your health care provider.  Drink enough water and fluids to keep your urine clear or pale yellow.  Keep the area around your vagina and rectum clean. ? Wash the area daily with warm water. ? Wipe yourself from front to back after using the toilet.  Keep all follow-up visits as told by your health care provider. This is important. How is this prevented?  Do not douche.  Wash the outside of your vagina with warm water only.  Use protection when having sex. This includes latex condoms and dental dams.  Limit how many sexual partners you have. To help prevent bacterial vaginosis, it is best to have sex with just one partner (  monogamous).  Make sure you and your sexual partner are tested for STIs.  Wear cotton or cotton-lined underwear.  Avoid wearing tight pants and pantyhose, especially during summer.  Limit the amount of alcohol that you drink.  Do not use any products that contain  nicotine or tobacco, such as cigarettes and e-cigarettes. If you need help quitting, ask your health care provider.  Do not use illegal drugs. Where to find more information  Centers for Disease Control and Prevention: www.cdc.gov/std  American Sexual Health Association (ASHA): www.ashastd.org  U.S. Department of Health and Human Services, Office on Women's Health: www.womenshealth.gov/ or https://www.womenshealth.gov/a-z-topics/bacterial-vaginosis Contact a health care provider if:  Your symptoms do not improve, even after treatment.  You have more discharge or pain when urinating.  You have a fever.  You have pain in your abdomen.  You have pain during sex.  You have vaginal bleeding between periods. Summary  Bacterial vaginosis is a vaginal infection that occurs when the normal balance of bacteria in the vagina is disrupted.  Because bacterial vaginosis increases your risk for STIs (sexually transmitted infections), getting treated can help reduce your risk for chlamydia, gonorrhea, herpes, and HIV (human immunodeficiency virus). Treatment is also important for preventing complications in pregnant women, because the condition can cause an early (premature) delivery.  This condition is treated with antibiotic medicines. These may be given as a pill, a vaginal cream, or a medicine that is put into the vagina (suppository). This information is not intended to replace advice given to you by your health care provider. Make sure you discuss any questions you have with your health care provider. Document Released: 01/23/2005 Document Revised: 01/05/2017 Document Reviewed: 10/09/2015 Elsevier Patient Education  2020 Elsevier Inc.  

## 2018-10-15 DIAGNOSIS — F3132 Bipolar disorder, current episode depressed, moderate: Secondary | ICD-10-CM | POA: Diagnosis not present

## 2018-11-26 ENCOUNTER — Encounter: Payer: Self-pay | Admitting: Nurse Practitioner

## 2018-11-26 ENCOUNTER — Other Ambulatory Visit: Payer: Self-pay

## 2018-11-26 ENCOUNTER — Ambulatory Visit (INDEPENDENT_AMBULATORY_CARE_PROVIDER_SITE_OTHER): Payer: Medicaid Other | Admitting: Nurse Practitioner

## 2018-11-26 DIAGNOSIS — F319 Bipolar disorder, unspecified: Secondary | ICD-10-CM

## 2018-11-26 DIAGNOSIS — M797 Fibromyalgia: Secondary | ICD-10-CM | POA: Diagnosis not present

## 2018-11-26 NOTE — Assessment & Plan Note (Signed)
Chronic, ongoing.  Denies SI/HI.  Continue current medication regimen, including Latuda, plus continue collaboration with psychiatry.  Return in 6 months for follow-up.

## 2018-11-26 NOTE — Assessment & Plan Note (Signed)
Chronic, ongoing.  Continue current medication regimen.  Continue collaboration visits with [facility].  Does not need refills on Tramadol today. 

## 2018-11-26 NOTE — Patient Instructions (Signed)
Bipolar 1 Disorder Bipolar 1 disorder is a mental health disorder in which a person has episodes of emotional highs (mania), and may also have episodes of emotional lows (depression) in addition to highs. Bipolar 1 disorder is different from other bipolar disorders because it involves extreme manic episodes. These episodes last at least one week or involve symptoms that are so severe that hospitalization is needed to keep the person safe. What increases the risk? The cause of this condition is not known. However, certain factors make you more likely to have bipolar disorder, such as:  Having a family member with the disorder.  An imbalance of certain chemicals in the brain (neurotransmitters).  Stress, such as illness, financial problems, or a death.  Certain conditions that affect the brain or spinal cord (neurologic conditions).  Brain injury (trauma).  Having another mental health disorder, such as: ? Obsessive compulsive disorder. ? Schizophrenia. What are the signs or symptoms? Symptoms of mania include:  Very high self-esteem or self-confidence.  Decreased need for sleep.  Unusual talkativeness or feeling a need to keep talking. Speech may be very fast. It may seem like you cannot stop talking.  Racing thoughts or constant talking, with quick shifts between topics that may or may not be related (flight of ideas).  Decreased ability to focus or concentrate.  Increased purposeful activity, such as work, studies, or social activity.  Increased nonproductive activity. This could be pacing, squirming and fidgeting, or finger and toe tapping.  Impulsive behavior and poor judgment. This may result in high-risk activities, such as having unprotected sex or spending a lot of money. Symptoms of depression include:  Feeling sad, hopeless, or helpless.  Frequent or uncontrollable crying.  Lack of feeling or caring about anything.  Sleeping too much.  Moving more slowly than  usual.  Not being able to enjoy things you used to enjoy.  Wanting to be alone all the time.  Feeling guilty or worthless.  Lack of energy or motivation.  Trouble concentrating or remembering.  Trouble making decisions.  Increased appetite.  Thoughts of death, or the desire to harm yourself. Sometimes, you may have a mixed mood. This means having symptoms of depression and mania. Stress can make symptoms worse. How is this diagnosed? To diagnose bipolar disorder, your health care provider may ask about your:  Emotional episodes.  Medical history.  Alcohol and drug use. This includes prescription medicines. Certain medical conditions and substances can cause symptoms that seem like bipolar disorder (secondary bipolar disorder). How is this treated? Bipolar disorder is a long-term (chronic) illness. It is best controlled with ongoing (continuous) treatment rather than treatment only when symptoms occur. Treatment may include:  Medicine. Medicine can be prescribed by a provider who specializes in treating mental disorders (psychiatrist). ? Medicines called mood stabilizers are usually prescribed. ? If symptoms occur even while taking a mood stabilizer, other medicines may be added.  Psychotherapy. Some forms of talk therapy, such as cognitive-behavioral therapy (CBT), can provide support, education, and guidance.  Coping methods, such as journaling or relaxation exercises. These may include: ? Yoga. ? Meditation. ? Deep breathing.  Lifestyle changes, such as: ? Limiting alcohol and drug use. ? Exercising regularly. ? Getting plenty of sleep. ? Making healthy eating choices.  A combination of medicine, talk therapy, and coping methods is best. A procedure in which electricity is applied to the brain through the scalp (electroconvulsive therapy) may be used in cases of severe mania when medicine and psychotherapy work too   slowly or do not work. Follow these instructions at  home: Activity   Return to your normal activities as told by your health care provider.  Find activities that you enjoy, and make time to do them.  Exercise regularly as told by your health care provider. Lifestyle  Limit alcohol intake to no more than 1 drink a day for nonpregnant women and 2 drinks a day for men. One drink equals 12 oz of beer, 5 oz of wine, or 1 oz of hard liquor.  Follow a set schedule for eating and sleeping.  Eat a balanced diet that includes fresh fruits and vegetables, whole grains, low-fat dairy, and lean meat.  Get 7-8 hours of sleep each night. General instructions  Take over-the-counter and prescription medicines only as told by your health care provider.  Think about joining a support group. Your health care provider may be able to recommend a support group.  Talk with your family and loved ones about your treatment goals and how they can help.  Keep all follow-up visits as told by your health care provider. This is important. Where to find more information For more information about bipolar disorder, visit the following websites:  National Alliance on Mental Illness: www.nami.org  U.S. National Institute of Mental Health: www.nimh.nih.gov Contact a health care provider if:  Your symptoms get worse.  You have side effects from your medicine, and they get worse.  You have trouble sleeping.  You have trouble doing daily activities.  You feel unsafe in your surroundings.  You are dealing with substance abuse. Get help right away if:  You have new symptoms.  You have thoughts about harming yourself.  You self-harm. This information is not intended to replace advice given to you by your health care provider. Make sure you discuss any questions you have with your health care provider. Document Released: 05/01/2000 Document Revised: 01/05/2017 Document Reviewed: 09/23/2015 Elsevier Patient Education  2020 Elsevier Inc.  

## 2018-11-26 NOTE — Progress Notes (Signed)
BP 104/68   Pulse 84   Temp 98.8 F (37.1 C) (Oral)   LMP 11/20/2018 (Exact Date)   SpO2 98%    Subjective:    Patient ID: Wanda Blanchard, female    DOB: 06-24-1975, 43 y.o.   MRN: [id number]  HPI: Wanda Blanchard is a 43 y.o. female  Chief Complaint  Patient presents with  . Depression  . Fibromyalgia   ANXIETY/DEPRESSION Is being followed by psychiatry, sees again in November.  Diagnosed with Bipolar I.  She feels that the Latuda 20 MG daily is "helping some", tried going up to 40 MG but this made her feel emotionless.  Was taken off of Prozac.  She states they are going to keep the Klonopin for her anxiety.  She reports good experience with psychiatry team and is feeling hopeful now she has a diagnosis in hand . Reports plans to start therapy in upcoming months. Duration:stable Anxious mood: yes Excessive worrying: no Irritability: no  Sweating: no Nausea: no Palpitations:no Hyperventilation: no Panic attacks: no Agoraphobia: no  Obscessions/compulsions: no Depressed mood: yes Depression screen [facility] 2/9 11/26/2018 08/26/2018 07/15/2018 06/11/2018 03/11/2018  Decreased Interest 1 1 2 2 1   Down, Depressed, Hopeless 1 2 2 2 1   PHQ - 2 Score 2 3 4 4 2   Altered sleeping 1 2 3 3 2   Tired, decreased energy 1 1 2 3 2   Change in appetite 1 3 3 2 1   Feeling bad or failure about yourself  1 1 1 2 1   Trouble concentrating 0 1 2 1  0  Moving slowly or fidgety/restless 1 1 3 1  0  Suicidal thoughts 2 1 1 1  0  PHQ-9 Score 9 13 19 17 8   Difficult doing work/chores Somewhat difficult Somewhat difficult Very difficult Very difficult Not difficult at all  Some recent data might be hidden   Anhedonia: no Weight changes: no Insomnia: yes hard to fall asleep  Hypersomnia: no Fatigue/loss of energy: yes Feelings of worthlessness: no Feelings of guilt: no Impaired concentration/indecisiveness: yes Suicidal ideations: no  Crying spells: no Recent Stressors/Life Changes: no  Relationship problems: no   Family stress: no     Financial stress: no    Job stress: no    Recent death/loss: no GAD 7 : Generalized Anxiety Score 11/26/2018 08/26/2018 06/11/2018 03/11/2018  Nervous, Anxious, on Edge 1 1 1 2   Control/stop worrying 2 1 1 1   Worry too much - different things 1 1 2 2   Trouble relaxing 1 3 3 2   Restless 1 1 2 1   Easily annoyed or irritable 2 3 2 2   Afraid - awful might happen 3 2 2  0  Total GAD 7 Score 11 12 13 10   Anxiety Difficulty Not difficult at all Somewhat difficult - Somewhat difficult    FIBROMYALGIA: Initial diagnosis in 2012. Is able to function on a daily basis with Tramadol, although reports she has not been using it as often due to concerns for addiction.  On review of database, she has not refilled since October.  At night she occasionally uses Tramadol. Does not take Klonopin and Tramadol at the same time. Uses Tramadol 1-2  times during the week. Is seeing [place] and receiving injections. Has been going to chiropractor since August for adjustments, has relief for three hours than pain returns.   Is also doing float therapy.  She reports she does not need refills on Tramadol today, as she has "a lot left at home".  Relevant past medical, surgical, family and social history reviewed and updated as indicated. Interim medical history since our last visit reviewed. Allergies and medications reviewed and updated.  Review of Systems  Constitutional: Negative for activity change, appetite change, diaphoresis, fatigue and fever.  Respiratory: Negative for cough, chest tightness and shortness of breath.   Cardiovascular: Negative for chest pain, palpitations and leg swelling.  Gastrointestinal: Negative for abdominal distention, abdominal pain, constipation, diarrhea, nausea and vomiting.  Neurological: Negative for dizziness, syncope, weakness, light-headedness, numbness and headaches.  Psychiatric/Behavioral: Positive for decreased  concentration and sleep disturbance. Negative for self-injury and suicidal ideas. The patient is nervous/anxious.     Per HPI unless specifically indicated above     Objective:    BP 104/68   Pulse 84   Temp 98.8 F (37.1 C) (Oral)   LMP 11/20/2018 (Exact Date)   SpO2 98%   Wt Readings from Last 3 Encounters:  10/08/18 115 lb (52.2 kg)  08/26/18 113 lb (51.3 kg)  07/15/18 115 lb (52.2 kg)    Physical Exam Vitals signs and nursing note reviewed.  Constitutional:      General: She is awake. She is not in acute distress.    Appearance: She is well-developed. She is not ill-appearing.  HENT:     Head: Normocephalic.     Right Ear: Hearing normal.     Left Ear: Hearing normal.     Nose: Nose normal.     Mouth/Throat:     Mouth: Mucous membranes are moist.  Eyes:     General: Lids are normal.        Right eye: No discharge.        Left eye: No discharge.     Conjunctiva/sclera: Conjunctivae normal.     Pupils: Pupils are equal, round, and reactive to light.  Neck:     Musculoskeletal: Normal range of motion and neck supple.     Thyroid: No thyromegaly.     Vascular: No carotid bruit or JVD.  Cardiovascular:     Rate and Rhythm: Normal rate and regular rhythm.     Heart sounds: Normal heart sounds. No murmur. No gallop.   Pulmonary:     Effort: Pulmonary effort is normal. No accessory muscle usage or respiratory distress.     Breath sounds: Normal breath sounds.  Abdominal:     General: Bowel sounds are normal.     Palpations: Abdomen is soft. There is no hepatomegaly or splenomegaly.  Musculoskeletal:     Right lower leg: No edema.     Left lower leg: No edema.  Lymphadenopathy:     Cervical: No cervical adenopathy.  Skin:    General: Skin is warm and dry.  Neurological:     Mental Status: She is alert and oriented to person, place, and time.  Psychiatric:        Attention and Perception: Attention normal.        Mood and Affect: Mood normal.        Behavior:  Behavior normal. Behavior is cooperative.        Thought Content: Thought content normal.        Judgment: Judgment normal.     Results for orders placed or performed in visit on 10/08/18  WET PREP FOR [place]ICH, YEAST, CLUE   Specimen: Vaginal Fluid   VAGINAL FLUI  Result Value Ref Range   Trichomonas Exam Negative Negative   Yeast Exam Negative Negative   Clue Cell Exam Positive (A) Negative  Assessment & Plan:   Problem List Items Addressed This Visit      Other   Bipolar I disorder (HCC)    Chronic, ongoing.  Denies SI/HI.  Continue current medication regimen, including Latuda, plus continue collaboration with psychiatry.  Return in 6 months for follow-up.        Fibromyalgia    Chronic, ongoing.  Continue current medication regimen.  Continue collaboration visits with [facility].  Does not need refills on Tramadol today.          Follow up plan: Return in about 6 months (around 05/27/2019) for Mood and Pain.

## 2018-12-05 DIAGNOSIS — H5213 Myopia, bilateral: Secondary | ICD-10-CM | POA: Diagnosis not present

## 2018-12-12 ENCOUNTER — Other Ambulatory Visit: Payer: Self-pay | Admitting: Nurse Practitioner

## 2018-12-12 NOTE — Telephone Encounter (Signed)
Requested medication (s) are due for refill today -yes  Requested medication (s) are on the active medication list -yes  Future visit scheduled -yes  Last refill: 11/16/18  Notes to clinic: Patient is requesting refill of non delegated Rx- sent for PCP review of request  Requested Prescriptions  Pending Prescriptions Disp Refills   clonazePAM (KLONOPIN) 0.5 MG tablet [Pharmacy Med Name: CLONAZEPAM 0.5 MG TABLET] 30 tablet 2    Sig: TAKE 1 TABLET (0.5 MG TOTAL) BY MOUTH DAILY AS NEEDED.     Not Delegated - Psychiatry:  Anxiolytics/Hypnotics Failed - 12/12/2018  3:45 PM      Failed - This refill cannot be delegated      Failed - Urine Drug Screen completed in last 360 days.      Passed - Valid encounter within last 6 months    Recent Outpatient Visits          2 weeks ago Bipolar I disorder ([place])   [place], Manley T, NP   2 months ago BV (bacterial vaginosis)   [place], Megan P, DO   3 months ago Bipolar I disorder ([place])   [place], Jolene T, NP   5 months ago Depression, unspecified depression type   [place], [place] T, NP   6 months ago Depression, unspecified depression type   [place], Barbaraann Faster, NP      Future Appointments            In 5 months Cann[place], Barbaraann Faster, NP [facility]              Requested Prescriptions  Pending Prescriptions Disp Refills   clonazePAM (KLONOPIN) 0.5 MG tablet [Pharmacy Med Name: CLONAZEPAM 0.5 MG TABLET] 30 tablet 2    Sig: TAKE 1 TABLET (0.5 MG TOTAL) BY MOUTH DAILY AS NEEDED.     Not Delegated - Psychiatry:  Anxiolytics/Hypnotics Failed - 12/12/2018  3:45 PM      Failed - This refill cannot be delegated      Failed - Urine Drug Screen completed in last 360 days.      Passed - Valid encounter within last 6 months    Recent Outpatient Visits          2 weeks ago Bipolar I disorder ([place])   [place], [place] T, NP   2 months ago BV (bacterial vaginosis)   [place], Megan P, DO   3 months ago Bipolar I disorder ([place])   [place] Cannady, Jolene T, NP   5 months ago Depression, unspecified depression type   [place], [place] T, NP   6 months ago Depression, unspecified depression type   [place], Barbaraann Faster, NP      Future Appointments            In 5 months Cannady, Barbaraann Faster, NP [facility]

## 2018-12-13 NOTE — Telephone Encounter (Signed)
Please alert Christe to have her psychiatrist fill this, she is currently being followed by psychiatry.

## 2018-12-16 ENCOUNTER — Other Ambulatory Visit: Payer: Self-pay | Admitting: Nurse Practitioner

## 2018-12-16 NOTE — Telephone Encounter (Signed)
Patient notified. She stated that she is aware that the psychiatrist will refill this.

## 2018-12-16 NOTE — Telephone Encounter (Signed)
She is being followed by psychiatry, please alert patient to call psychiatrist for refills to be sent in on this.  Thank you.

## 2018-12-16 NOTE — Telephone Encounter (Signed)
Medication Refill - Medication: clonazePAM (KLONOPIN) 0.5 MG tablet  Pt is down to one dose for tonight, pt stated that a partial supply is sufficient. [place] has delayed her refill, Provider forgot.   Has the patient contacted their pharmacy? Yes.   (Agent: If no, request that the patient contact the pharmacy for the refill.) (Agent: If yes, when and what did the pharmacy advise?)  Preferred Pharmacy (with phone number or street name):  [id number] - [place], [address]  [address] [place] [address]  Phone: [phone] Fax: [phone]    Agent: Please be advised that RX refills may take up to 3 business days. We ask that you follow-up with your pharmacy.

## 2018-12-16 NOTE — Telephone Encounter (Signed)
LVM for pt to call back.

## 2018-12-16 NOTE — Telephone Encounter (Signed)
Requested medication (s) are due for refill today: yes  Requested medication (s) are on the active medication list: yes  Last refill:  09/14/2018  Future visit scheduled: yes  Notes to clinic:  Pt is down to one dose for tonight, pt stated that a partial supply is sufficient. [place] has delayed her refill, Provider forgot.     Requested Prescriptions  Pending Prescriptions Disp Refills   clonazePAM (KLONOPIN) 0.5 MG tablet 30 tablet 2    Sig: Take 1 tablet (0.5 mg total) by mouth daily as needed.     Not Delegated - Psychiatry:  Anxiolytics/Hypnotics Failed - 12/16/2018  1:47 PM      Failed - This refill cannot be delegated      Failed - Urine Drug Screen completed in last 360 days.      Passed - Valid encounter within last 6 months    Recent Outpatient Visits          2 weeks ago Bipolar I disorder ([place])   [place], [place] T, NP   2 months ago BV (bacterial vaginosis)   [place], Megan P, DO   3 months ago Bipolar I disorder ([place])   [place], Jolene T, NP   5 months ago Depression, unspecified depression type   [place], [place] T, NP   6 months ago Depression, unspecified depression type   [place], Barbaraann Faster, NP      Future Appointments            In 5 months Cannady, Barbaraann Faster, NP [facility]

## 2018-12-18 NOTE — Telephone Encounter (Signed)
LVM for pt to call back regarding refill.

## 2019-01-14 DIAGNOSIS — F3132 Bipolar disorder, current episode depressed, moderate: Secondary | ICD-10-CM | POA: Diagnosis not present

## 2019-02-14 ENCOUNTER — Ambulatory Visit: Payer: Medicaid Other | Admitting: Nurse Practitioner

## 2019-03-05 DIAGNOSIS — Z20828 Contact with and (suspected) exposure to other viral communicable diseases: Secondary | ICD-10-CM | POA: Diagnosis not present

## 2019-03-19 DIAGNOSIS — M5416 Radiculopathy, lumbar region: Secondary | ICD-10-CM | POA: Diagnosis not present

## 2019-03-19 DIAGNOSIS — M5136 Other intervertebral disc degeneration, lumbar region: Secondary | ICD-10-CM | POA: Diagnosis not present

## 2019-03-28 DIAGNOSIS — M5416 Radiculopathy, lumbar region: Secondary | ICD-10-CM | POA: Diagnosis not present

## 2019-03-28 DIAGNOSIS — M5136 Other intervertebral disc degeneration, lumbar region: Secondary | ICD-10-CM | POA: Diagnosis not present

## 2019-04-08 DIAGNOSIS — F3132 Bipolar disorder, current episode depressed, moderate: Secondary | ICD-10-CM | POA: Diagnosis not present

## 2019-04-11 DIAGNOSIS — M5416 Radiculopathy, lumbar region: Secondary | ICD-10-CM | POA: Diagnosis not present

## 2019-04-11 DIAGNOSIS — M5136 Other intervertebral disc degeneration, lumbar region: Secondary | ICD-10-CM | POA: Diagnosis not present

## 2019-05-12 DIAGNOSIS — M5136 Other intervertebral disc degeneration, lumbar region: Secondary | ICD-10-CM | POA: Diagnosis not present

## 2019-05-12 DIAGNOSIS — M5416 Radiculopathy, lumbar region: Secondary | ICD-10-CM | POA: Diagnosis not present

## 2019-05-19 ENCOUNTER — Encounter: Payer: Self-pay | Admitting: Nurse Practitioner

## 2019-05-19 ENCOUNTER — Ambulatory Visit: Payer: Medicaid Other | Admitting: Nurse Practitioner

## 2019-05-19 ENCOUNTER — Other Ambulatory Visit: Payer: Self-pay

## 2019-05-19 VITALS — BP 105/71 | HR 85 | Temp 98.7°F | Ht 66.5 in | Wt 117.2 lb

## 2019-05-19 DIAGNOSIS — M797 Fibromyalgia: Secondary | ICD-10-CM

## 2019-05-19 DIAGNOSIS — F319 Bipolar disorder, unspecified: Secondary | ICD-10-CM

## 2019-05-19 DIAGNOSIS — F419 Anxiety disorder, unspecified: Secondary | ICD-10-CM

## 2019-05-19 NOTE — Assessment & Plan Note (Signed)
Chronic, ongoing.  Continue current medication regimen, Klonopin, and collaboration with psychiatry.  Discussed with patient that will refill Klonopin in August when due, but from here on out prefer she keep all her psychiatry medications with psychiatry team.  She agrees with this plan.

## 2019-05-19 NOTE — Progress Notes (Signed)
BP 105/71   Pulse 85   Temp 98.7 F (37.1 C) (Oral)   Ht 5' 6.5" (1.689 m)   Wt 117 lb 3.2 oz (53.2 kg)   LMP 05/11/2019 (Exact Date)   SpO2 98%   BMI 18.63 kg/m    Subjective:    Patient ID: Wanda Blanchard, female    DOB: November 15, 1975, 44 y.o.   MRN: [id number]  HPI: [name] is a 44 y.o. female  Chief Complaint  Patient presents with  . Manic Behavior  . Fibromyalgia   ANXIETY/DEPRESSION Is being followed by psychiatry, saw last in March 2021.  Diagnosed with Bipolar I.  Continues on [place].  She states they are going to keep the Klonopin for her anxiety.  She reports good experience with psychiatry team and is feeling hopeful now she has a diagnosis in hand . Reports plans to start therapy in upcoming months. Duration:stable Anxious mood: yes Excessive worrying: no Irritability: no  Sweating: no Nausea: no Palpitations:no Hyperventilation: no Panic attacks: no Agoraphobia: no  Obscessions/compulsions: no Depressed mood: yes Depression screen [facility] 2/9 05/19/2019 11/26/2018 08/26/2018 07/15/2018 06/11/2018  Decreased Interest 1 1 1 2 2   Down, Depressed, Hopeless 1 1 2 2 2   PHQ - 2 Score 2 2 3 4 4   Altered sleeping 1 1 2 3 3   Tired, decreased energy 1 1 1 2 3   Change in appetite 3 1 3 3 2   Feeling bad or failure about yourself  1 1 1 1 2   Trouble concentrating 1 0 1 2 1   Moving slowly or fidgety/restless 0 1 1 3 1   Suicidal thoughts 0 2 1 1 1   PHQ-9 Score 9 9 13 19 17   Difficult doing work/chores Somewhat difficult Somewhat difficult Somewhat difficult Very difficult Very difficult  Some recent data might be hidden   Anhedonia: no Weight changes: no Insomnia: yes hard to fall asleep  Hypersomnia: no Fatigue/loss of energy: yes Feelings of worthlessness: no Feelings of guilt: no Impaired concentration/indecisiveness: yes Suicidal ideations: no  Crying spells: no Recent Stressors/Life Changes: no   Relationship problems: no   Family stress:  no     Financial stress: no    Job stress: no    Recent death/loss: no GAD 7 : Generalized Anxiety Score 05/19/2019 11/26/2018 08/26/2018 06/11/2018  Nervous, Anxious, on Edge 1 1 1 1   Control/stop worrying 2 2 1 1   Worry too much - different things 2 1 1 2   Trouble relaxing 1 1 3 3   Restless 0 1 1 2   Easily annoyed or irritable 2 2 3 2   Afraid - awful might happen 1 3 2 2   Total GAD 7 Score 9 11 12 13   Anxiety Difficulty Somewhat difficult Not difficult at all Somewhat difficult -    FIBROMYALGIA: Initial diagnosis in 2012. Is able to function on a daily basis with Tramadol, although reports she has not been using it as often due to concerns for addiction.  On review of database, she has not refilled since March 2020 -- #28 pills at that time, she minimally uses.  At night she occasionally uses Tramadol. Does not take Klonopin and Tramadol at the same time. Uses Tramadol 1-2  times during the week if needed. Is seeing [facility] and receiving injections, in back had done in February 2021. Has been going to chiropractor.  She reports she does not need refills on Tramadol today, as she has "a lot left at home".  Relevant past medical, surgical, family and social history reviewed and updated as indicated. Interim medical history since our last visit reviewed. Allergies and medications reviewed and updated.  Review of Systems  Constitutional: Negative for activity change, appetite change, diaphoresis, fatigue and fever.  Respiratory: Negative for cough, chest tightness and shortness of breath.   Cardiovascular: Negative for chest pain, palpitations and leg swelling.  Gastrointestinal: Negative for abdominal distention, abdominal pain, constipation, diarrhea, nausea and vomiting.  Neurological: Negative for dizziness, syncope, weakness, light-headedness, numbness and headaches.  Psychiatric/Behavioral: Positive for decreased concentration and sleep disturbance. Negative for  self-injury and suicidal ideas. The patient is nervous/anxious.     Per HPI unless specifically indicated above     Objective:    BP 105/71   Pulse 85   Temp 98.7 F (37.1 C) (Oral)   Ht 5' 6.5" (1.689 m)   Wt 117 lb 3.2 oz (53.2 kg)   LMP 05/11/2019 (Exact Date)   SpO2 98%   BMI 18.63 kg/m   Wt Readings from Last 3 Encounters:  05/19/19 117 lb 3.2 oz (53.2 kg)  10/08/18 115 lb (52.2 kg)  08/26/18 113 lb (51.3 kg)    Physical Exam Vitals and nursing note reviewed.  Constitutional:      General: She is awake. She is not in acute distress.    Appearance: She is well-developed. She is not ill-appearing.  HENT:     Head: Normocephalic.     Right Ear: Hearing normal.     Left Ear: Hearing normal.     Nose: Nose normal.     Mouth/Throat:     Mouth: Mucous membranes are moist.  Eyes:     General: Lids are normal.        Right eye: No discharge.        Left eye: No discharge.     Conjunctiva/sclera: Conjunctivae normal.     Pupils: Pupils are equal, round, and reactive to light.  Neck:     Thyroid: No thyromegaly.     Vascular: No carotid bruit or JVD.  Cardiovascular:     Rate and Rhythm: Normal rate and regular rhythm.     Heart sounds: Normal heart sounds. No murmur. No gallop.   Pulmonary:     Effort: Pulmonary effort is normal. No accessory muscle usage or respiratory distress.     Breath sounds: Normal breath sounds.  Abdominal:     General: Bowel sounds are normal.     Palpations: Abdomen is soft. There is no hepatomegaly or splenomegaly.  Musculoskeletal:     Cervical back: Normal range of motion and neck supple.     Right lower leg: No edema.     Left lower leg: No edema.  Lymphadenopathy:     Cervical: No cervical adenopathy.  Skin:    General: Skin is warm and dry.  Neurological:     Mental Status: She is alert and oriented to person, place, and time.  Psychiatric:        Attention and Perception: Attention normal.        Mood and Affect: Mood  normal.        Behavior: Behavior normal. Behavior is cooperative.        Thought Content: Thought content normal.        Judgment: Judgment normal.     Results for orders placed or performed in visit on 10/08/18  WET PREP FOR TRICH, YEAST, CLUE   Specimen: Vaginal Fluid   VAGINAL FLUI  Result  Value Ref Range   Trichomonas Exam Negative Negative   Yeast Exam Negative Negative   Clue Cell Exam Positive (A) Negative      Assessment & Plan:   Problem List Items Addressed This Visit      Other   Bipolar I disorder (HCC) - Primary    Chronic, ongoing.  Denies SI/HI.  Continue current medication regimen, including Latuda, plus continue collaboration with psychiatry.  Return in 6 months for follow-up and physical with pap.      Anxiety    Chronic, ongoing.  Continue current medication regimen, Klonopin, and collaboration with psychiatry.  Discussed with patient that will refill Klonopin in August when due, but from here on out prefer she keep all her psychiatry medications with psychiatry team.  She agrees with this plan.       Fibromyalgia    Chronic, ongoing.  Continue current medication regimen.  Continue collaboration visits with [facility]  Does not need refills on Tramadol today.          Follow up plan: Return in about 6 months (around 11/18/2019) for Physical with pap.

## 2019-05-19 NOTE — Assessment & Plan Note (Signed)
Chronic, ongoing.  Continue current medication regimen.  Continue collaboration visits with [facility].  Does not need refills on Tramadol today. 

## 2019-05-19 NOTE — Patient Instructions (Signed)

## 2019-05-19 NOTE — Assessment & Plan Note (Signed)
Chronic, ongoing.  Denies SI/HI.  Continue current medication regimen, including Latuda, plus continue collaboration with psychiatry.  Return in 6 months for follow-up and physical with pap.

## 2019-07-01 DIAGNOSIS — F3132 Bipolar disorder, current episode depressed, moderate: Secondary | ICD-10-CM | POA: Diagnosis not present

## 2019-08-06 ENCOUNTER — Other Ambulatory Visit: Payer: Self-pay | Admitting: Nurse Practitioner

## 2019-08-06 MED ORDER — CYCLOBENZAPRINE HCL 10 MG PO TABS: 10.0000 mg | ORAL_TABLET | Freq: Every day | ORAL | 3 refills | Status: DC

## 2019-08-06 NOTE — Telephone Encounter (Signed)
Routing to provider  

## 2019-08-06 NOTE — Telephone Encounter (Signed)
Requested medication (s) are due for refill today:  Yes  Requested medication (s) are on the active medication list:  yes  Future visit scheduled:  yes  Last Refill: 06/11/18; #90; refill x 3  Note to clinic: Pt. reported she "thought she might be ok without, but finding she does need to have on hand."  Medication is not delegated.   Requested Prescriptions  Pending Prescriptions Disp Refills   cyclobenzaprine (FLEXERIL) 10 MG tablet 90 tablet 3    Sig: Take 1 tablet (10 mg total) by mouth at bedtime.      Not Delegated - Analgesics:  Muscle Relaxants Failed - 08/06/2019  9:35 AM      Failed - This refill cannot be delegated      Passed - Valid encounter within last 6 months    Recent Outpatient Visits           2 months ago Bipolar I disorder (HCC)   [facility] Cannady, Jolene T, NP   8 months ago Bipolar I disorder (HCC)   [facility] Cannady, Jolene T, NP   10 months ago BV (bacterial vaginosis)   [facility] [place], Megan P, DO   11 months ago Bipolar I disorder (HCC)   [facility] Cannady, Jolene T, NP   1 year ago Depression, unspecified depression type   [facility] [place], Dorie Rank, NP       Future Appointments             In 3 months Cannady, Dorie Rank, NP [employer] [facility]

## 2019-08-06 NOTE — Telephone Encounter (Signed)
Pt request refill  cyclobenzaprine (FLEXERIL) 10 MG tablet  Pt states she thought she might be ok without, but finding she does need to have on hand.  [id number] - [place], [place] - [address] Phone:  [phone]  Fax:  [phone]

## 2019-09-15 ENCOUNTER — Other Ambulatory Visit: Payer: Self-pay | Admitting: Nurse Practitioner

## 2019-09-23 DIAGNOSIS — Z79899 Other long term (current) drug therapy: Secondary | ICD-10-CM | POA: Diagnosis not present

## 2019-09-23 DIAGNOSIS — Z1389 Encounter for screening for other disorder: Secondary | ICD-10-CM | POA: Diagnosis not present

## 2019-09-23 DIAGNOSIS — F3132 Bipolar disorder, current episode depressed, moderate: Secondary | ICD-10-CM | POA: Diagnosis not present

## 2019-10-07 ENCOUNTER — Other Ambulatory Visit: Payer: Self-pay | Admitting: Podiatry

## 2019-10-07 ENCOUNTER — Ambulatory Visit (INDEPENDENT_AMBULATORY_CARE_PROVIDER_SITE_OTHER): Payer: Medicaid Other | Admitting: Podiatry

## 2019-10-07 ENCOUNTER — Other Ambulatory Visit: Payer: Self-pay

## 2019-10-07 ENCOUNTER — Encounter: Payer: Self-pay | Admitting: Podiatry

## 2019-10-07 ENCOUNTER — Ambulatory Visit (INDEPENDENT_AMBULATORY_CARE_PROVIDER_SITE_OTHER): Payer: Medicaid Other

## 2019-10-07 DIAGNOSIS — M778 Other enthesopathies, not elsewhere classified: Secondary | ICD-10-CM

## 2019-10-07 DIAGNOSIS — G5761 Lesion of plantar nerve, right lower limb: Secondary | ICD-10-CM | POA: Diagnosis not present

## 2019-10-07 DIAGNOSIS — M792 Neuralgia and neuritis, unspecified: Secondary | ICD-10-CM

## 2019-10-08 ENCOUNTER — Encounter: Payer: Self-pay | Admitting: Podiatry
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
## 2019-10-16 ENCOUNTER — Encounter: Payer: Self-pay | Admitting: Podiatry

## 2019-10-16 ENCOUNTER — Ambulatory Visit: Payer: Medicaid Other | Admitting: Podiatry

## 2019-10-16 ENCOUNTER — Other Ambulatory Visit: Payer: Self-pay

## 2019-10-16 DIAGNOSIS — G5761 Lesion of plantar nerve, right lower limb: Secondary | ICD-10-CM | POA: Diagnosis not present

## 2019-10-21 ENCOUNTER — Encounter: Payer: Self-pay | Admitting: Podiatry
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
## 2019-11-10 ENCOUNTER — Telehealth: Payer: Self-pay

## 2019-11-10 NOTE — Telephone Encounter (Signed)
Called pt no answer left vm. Pt would need an appt and we don't have anything this week, was going to [facility]    Copied from CRM #[id number]. Topic: General - Other >> Nov 10, 2019  9:09 AM Jaquita Rector A wrote: Reason for CRM: Patient called to inquire of Jolene Cannady if there is a way to get something for a UTI have been using AZO but still having symptoms, abdominal discomfort, not feeling empty after urinating, vaginal burning, frequent urgency. Willing to do a visit today but nothing was available.  Please call Ph# [phone] >> Nov 10, 2019  1:38 PM Adela Ports wrote: Unable to reach pt.

## 2019-11-12 DIAGNOSIS — M9903 Segmental and somatic dysfunction of lumbar region: Secondary | ICD-10-CM | POA: Diagnosis not present

## 2019-11-12 DIAGNOSIS — M9901 Segmental and somatic dysfunction of cervical region: Secondary | ICD-10-CM | POA: Diagnosis not present

## 2019-11-12 DIAGNOSIS — M9905 Segmental and somatic dysfunction of pelvic region: Secondary | ICD-10-CM | POA: Diagnosis not present

## 2019-11-12 DIAGNOSIS — M5412 Radiculopathy, cervical region: Secondary | ICD-10-CM | POA: Diagnosis not present

## 2019-11-12 DIAGNOSIS — M5431 Sciatica, right side: Secondary | ICD-10-CM | POA: Diagnosis not present

## 2019-11-12 DIAGNOSIS — M41117 Juvenile idiopathic scoliosis, lumbosacral region: Secondary | ICD-10-CM | POA: Diagnosis not present

## 2019-11-24 ENCOUNTER — Ambulatory Visit (INDEPENDENT_AMBULATORY_CARE_PROVIDER_SITE_OTHER): Payer: Medicaid Other | Admitting: Nurse Practitioner

## 2019-11-24 ENCOUNTER — Other Ambulatory Visit: Payer: Self-pay

## 2019-11-24 ENCOUNTER — Other Ambulatory Visit (HOSPITAL_COMMUNITY)
Admission: RE | Admit: 2019-11-24 | Discharge: 2019-11-24 | Disposition: A | Discharge status: Home / Self Care | Payer: Medicaid Other | Source: Ambulatory Visit | Attending: Nurse Practitioner | Admitting: Nurse Practitioner

## 2019-11-24 ENCOUNTER — Encounter: Payer: Self-pay | Admitting: Nurse Practitioner

## 2019-11-24 VITALS — BP 95/69 | HR 90 | Temp 98.6°F | Resp 16 | Ht 67.0 in | Wt 117.0 lb

## 2019-11-24 DIAGNOSIS — Z13220 Encounter for screening for lipoid disorders: Secondary | ICD-10-CM

## 2019-11-24 DIAGNOSIS — Z0000 Encounter for general adult medical examination without abnormal findings: Secondary | ICD-10-CM | POA: Diagnosis not present

## 2019-11-24 DIAGNOSIS — Z124 Encounter for screening for malignant neoplasm of cervix: Secondary | ICD-10-CM

## 2019-11-24 DIAGNOSIS — Z1329 Encounter for screening for other suspected endocrine disorder: Secondary | ICD-10-CM | POA: Diagnosis not present

## 2019-11-24 DIAGNOSIS — Z1159 Encounter for screening for other viral diseases: Secondary | ICD-10-CM

## 2019-11-24 NOTE — Patient Instructions (Signed)

## 2019-11-24 NOTE — Progress Notes (Signed)
BP 95/69 (BP Location: Left Arm, Patient Position: Sitting, Cuff Size: Normal)   Pulse 90   Temp 98.6 F (37 C) (Oral)   Resp 16   Ht 5\' 7"  (1.702 m)   Wt 117 lb (53.1 kg)   LMP 11/02/2019   SpO2 100%   BMI 18.32 kg/m    Subjective:    Patient ID: 11/04/2019, female    DOB: [birth year], 44 y.o.   MRN: [id number]  HPI: Wanda Blanchard is a 44 y.o. female presenting on 11/24/2019 for comprehensive medical examination. Current medical complaints include:none  She currently lives with: husband Menopausal Symptoms: no  Depression Screen done today and results listed below:  Depression screen [facility] 2/9 05/19/2019 11/26/2018 08/26/2018 07/15/2018 06/11/2018  Decreased Interest 1 1 1 2 2   Down, Depressed, Hopeless 1 1 2 2 2   PHQ - 2 Score 2 2 3 4 4   Altered sleeping 1 1 2 3 3   Tired, decreased energy 1 1 1 2 3   Change in appetite 3 1 3 3 2   Feeling bad or failure about yourself  1 1 1 1 2   Trouble concentrating 1 0 1 2 1   Moving slowly or fidgety/restless 0 1 1 3 1   Suicidal thoughts 0 2 1 1 1   PHQ-9 Score 9 9 13 19 17   Difficult doing work/chores Somewhat difficult Somewhat difficult Somewhat difficult Very difficult Very difficult  Some recent data might be hidden    The patient does not have a history of falls. I did not complete a risk assessment for falls. A plan of care for falls was not documented.   Past Medical History:  Past Medical History:  Diagnosis Date  . Anxiety   . Constipation   . Depression   . Fibromyalgia   . Insomnia   . Migraine   . Myalgia and myositis   . Ruptured ovarian cyst   . Whole body pain     Surgical History:  Past Surgical History:  Procedure Laterality Date  . CESAREAN SECTION    . e sure    . PLACEMENT OF BREAST IMPLANTS  2009    Medications:  Current Outpatient Medications on File Prior to Visit  Medication Sig  . BIOTIN 5000 PO Take 5,000 mg by mouth daily.  . Cholecalciferol (VITAMIN D3) 5000 UNITS TABS Take 5,000 Units  by mouth daily.  . clonazePAM (KLONOPIN) 0.5 MG tablet TAKE 1 TABLET (0.5 MG TOTAL) BY MOUTH DAILY AS NEEDED.  cyclobenzaprine (FLEXERIL) 10 MG tablet Take 1 tablet (10 mg total) by mouth at bedtime.  LATUDA 20 MG TABS tablet Take 20 mg by mouth daily.   . Multiple Vitamins-Minerals (WOMENS MULTIVITAMIN PLUS PO) Take by mouth daily.  . SUMAtriptan (IMITREX) 50 MG tablet TAKE 1 TABLET (50 MG TOTAL) BY MOUTH EVERY 2 (TWO) HOURS AS NEEDED FOR MIGRAINE. MAY REPEAT IN 2 HOURS IF HEADACHE PERSISTS OR RECURS.  . traMADol (ULTRAM) 50 MG tablet Take 1 tablet (50 mg total) by mouth every 6 (six) hours as needed.   Current Facility-Administered Medications on File Prior to Visit  Medication  . betamethasone acetate-betamethasone sodium phosphate (CELESTONE) injection 3 mg    Allergies:  No Known Allergies  Social History:  Social History   Socioeconomic History  . Marital status: Married    Spouse name: Not on file  . Number of children: Not on file  . Years of education: Not on file  . Highest education level: Not on  file  Occupational History  . Not on file  Tobacco Use  . Smoking status: Former Smoker    Types: Cigarettes    Quit date: 04/21/2014    Years since quitting: 5.5  . Smokeless tobacco: Never Used  Vaping Use  . Vaping Use: Every day  Substance and Sexual Activity  . Alcohol use: Yes    Alcohol/week: 0.0 standard drinks    Comment: pt states every now and then  . Drug use: No  . Sexual activity: Yes    Birth control/protection: Other-see comments    Comment: e-sure  Other Topics Concern  . Not on file  Social History Narrative  . Not on file   Social Determinants of Health   Financial Resource Strain:   . Difficulty of Paying Living Expenses: Not on file  Food Insecurity:   . Worried About Programme researcher, broadcasting/film/video in the Last Year: Not on file  . Ran Out of Food in the Last Year: Not on file  Transportation Needs:   . Lack of Transportation (Medical): Not on  file  . Lack of Transportation (Non-Medical): Not on file  Physical Activity:   . Days of Exercise per Week: Not on file  . Minutes of Exercise per Session: Not on file  Stress:   . Feeling of Stress : Not on file  Social Connections:   . Frequency of Communication with Friends and Family: Not on file  . Frequency of Social Gatherings with Friends and Family: Not on file  . Attends Religious Services: Not on file  . Active Member of Clubs or Organizations: Not on file  . Attends Banker Meetings: Not on file  . Marital Status: Not on file  Intimate Partner Violence:   . Fear of Current or Ex-Partner: Not on file  . Emotionally Abused: Not on file  . Physically Abused: Not on file  . Sexually Abused: Not on file   Social History   Tobacco Use  Smoking Status Former Smoker  . Types: Cigarettes  . Quit date: 04/21/2014  . Years since quitting: 5.5  Smokeless Tobacco Never Used   Social History   Substance and Sexual Activity  Alcohol Use Yes  . Alcohol/week: 0.0 standard drinks   Comment: pt states every now and then    Family History:  Family History  Problem Relation Age of Onset  . Migraines Mother   . Cancer Father        prostate  . Heart disease Father   . Lung disease Father   . Autism Son   . Stroke Paternal Aunt   . Diabetes Paternal Uncle     Past medical history, surgical history, medications, allergies, family history and social history reviewed with patient today and changes made to appropriate areas of the chart.   Review of Systems - negative All other ROS negative except what is listed above and in the HPI.      Objective:    BP 95/69 (BP Location: Left Arm, Patient Position: Sitting, Cuff Size: Normal)   Pulse 90   Temp 98.6 F (37 C) (Oral)   Resp 16   Ht 5\' 7"  (1.702 m)   Wt 117 lb (53.1 kg)   LMP 11/02/2019   SpO2 100%   BMI 18.32 kg/m   Wt Readings from Last 3 Encounters:  11/24/19 117 lb (53.1 kg)  05/19/19 117 lb  3.2 oz (53.2 kg)  10/08/18 115 lb (52.2 kg)    Physical Exam  Vitals and nursing note reviewed.  Constitutional:      General: She is awake. She is not in acute distress.    Appearance: She is well-developed. She is not ill-appearing.  HENT:     Head: Normocephalic and atraumatic.     Right Ear: Hearing, tympanic membrane, ear canal and external ear normal. No drainage.     Left Ear: Hearing, tympanic membrane, ear canal and external ear normal. No drainage.     Nose: Nose normal.     Right Sinus: No maxillary sinus tenderness or frontal sinus tenderness.     Left Sinus: No maxillary sinus tenderness or frontal sinus tenderness.     Mouth/Throat:     Mouth: Mucous membranes are moist.     Pharynx: Oropharynx is clear. Uvula midline. No pharyngeal swelling, oropharyngeal exudate or posterior oropharyngeal erythema.  Eyes:     General: Lids are normal.        Right eye: No discharge.        Left eye: No discharge.     Extraocular Movements: Extraocular movements intact.     Conjunctiva/sclera: Conjunctivae normal.     Pupils: Pupils are equal, round, and reactive to light.     Visual Fields: Right eye visual fields normal and left eye visual fields normal.  Neck:     Thyroid: No thyromegaly.     Vascular: No carotid bruit.     Trachea: Trachea normal.  Cardiovascular:     Rate and Rhythm: Normal rate and regular rhythm.     Heart sounds: Normal heart sounds. No murmur heard.  No gallop.   Pulmonary:     Effort: Pulmonary effort is normal. No accessory muscle usage or respiratory distress.     Breath sounds: Normal breath sounds.  Chest:     Breasts:        Right: Normal.        Left: Normal.     Comments: Implants bilateral Abdominal:     General: Bowel sounds are normal.     Palpations: Abdomen is soft. There is no hepatomegaly or splenomegaly.     Tenderness: There is no abdominal tenderness.     Hernia: There is no hernia in the left inguinal area or right inguinal area.   Genitourinary:    Exam position: Lithotomy position.     Labia:        Right: No rash.        Left: No rash.      Vagina: Normal.     Cervix: Normal.     Uterus: Normal.      Adnexa: Right adnexa normal and left adnexa normal.     Rectum: Normal.     Comments: Declined chaperone.  Pap performed, cervix anterior and viewed.  Sample obtained and sent to lab.  No discomfort reported. Musculoskeletal:        General: Normal range of motion.     Cervical back: Normal range of motion and neck supple.     Right lower leg: No edema.     Left lower leg: No edema.  Lymphadenopathy:     Head:     Right side of head: No submental, submandibular, tonsillar, preauricular or posterior auricular adenopathy.     Left side of head: No submental, submandibular, tonsillar, preauricular or posterior auricular adenopathy.     Cervical: No cervical adenopathy.     Upper Body:     Right upper body: No supraclavicular, axillary or pectoral adenopathy.  Left upper body: No supraclavicular, axillary or pectoral adenopathy.  Skin:    General: Skin is warm and dry.     Capillary Refill: Capillary refill takes less than 2 seconds.     Findings: No rash.  Neurological:     Mental Status: She is alert and oriented to person, place, and time.     Cranial Nerves: Cranial nerves are intact.     Gait: Gait is intact.     Deep Tendon Reflexes: Reflexes are normal and symmetric.     Reflex Scores:      Brachioradialis reflexes are 2+ on the right side and 2+ on the left side.      Patellar reflexes are 2+ on the right side and 2+ on the left side. Psychiatric:        Attention and Perception: Attention normal.        Mood and Affect: Mood normal.        Speech: Speech normal.        Behavior: Behavior normal. Behavior is cooperative.        Thought Content: Thought content normal.        Judgment: Judgment normal.     Results for orders placed or performed in visit on 10/08/18  WET PREP FOR TRICH,  YEAST, CLUE   Specimen: Vaginal Fluid   VAGINAL FLUI  Result Value Ref Range   Trichomonas Exam Negative Negative   Yeast Exam Negative Negative   Clue Cell Exam Positive (A) Negative      Assessment & Plan:   Problem List Items Addressed This Visit    None    Visit Diagnoses    Routine general medical examination at a health care facility    -  Primary   Annual labs today to include CBC, CMP, TSH, Lipid   Relevant Orders   CBC with Differential/Platelet   Comprehensive metabolic panel   Lipid Panel w/o Chol/HDL Ratio   TSH   Cervical cancer screening       Pap obtained today and sent to lab for screening   Relevant Orders   Cytology - PAP   Thyroid disorder screening       TSH on labs today   Relevant Orders   TSH   Screening cholesterol level       Lipid panel on labs today   Relevant Orders   Lipid Panel w/o Chol/HDL Ratio   Need for hepatitis C screening test       Hep C screening on labs today per guideline recommendations   Relevant Orders   Hepatitis C antibody       Follow up plan: Return in about 6 months (around 05/24/2020) for MOOD AND FIBROMYALGIA.   LABORATORY TESTING:  - Pap smear: pap done  IMMUNIZATIONS:   - Tdap: Tetanus vaccination status reviewed: last tetanus booster within 10 years. - Influenza: Refused - Pneumovax: Not applicable - Prevnar: Not applicable - HPV: Not applicable - Zostavax vaccine: Not applicable  SCREENING: -Mammogram: Not applicable  - Colonoscopy: Not applicable  - Bone Density: Not applicable  -Hearing Test: Not applicable  -Spirometry: Not applicable   PATIENT COUNSELING:   Advised to take 1 mg of folate supplement per day if capable of pregnancy.   Sexuality: Discussed sexually transmitted diseases, partner selection, use of condoms, avoidance of unintended pregnancy  and contraceptive alternatives.   Advised to avoid cigarette smoking.  I discussed with the patient that most people either abstain from  alcohol or drink  within safe limits (<=14/week and <=4 drinks/occasion for males, <=7/weeks and <= 3 drinks/occasion for females) and that the risk for alcohol disorders and other health effects rises proportionally with the number of drinks per week and how often a drinker exceeds daily limits.  Discussed cessation/primary prevention of drug use and availability of treatment for abuse.   Diet: Encouraged to adjust caloric intake to maintain  or achieve ideal body weight, to reduce intake of dietary saturated fat and total fat, to limit sodium intake by avoiding high sodium foods and not adding table salt, and to maintain adequate dietary potassium and calcium preferably from fresh fruits, vegetables, and low-fat dairy products.    stressed the importance of regular exercise  Injury prevention: Discussed safety belts, safety helmets, smoke detector, smoking near bedding or upholstery.   Dental health: Discussed importance of regular tooth brushing, flossing, and dental visits.    NEXT PREVENTATIVE PHYSICAL DUE IN 1 YEAR. Return in about 6 months (around 05/24/2020) for MOOD AND FIBROMYALGIA.

## 2019-11-25 LAB — COMPREHENSIVE METABOLIC PANEL
ALT: 9 IU/L (ref 0–32)
AST: 11 IU/L (ref 0–40)
Albumin/Globulin Ratio: 2 (ref 1.2–2.2)
Albumin: 4.7 g/dL (ref 3.8–4.8)
Alkaline Phosphatase: 61 IU/L (ref 44–121)
BUN/Creatinine Ratio: 11 (ref 9–23)
BUN: 10 mg/dL (ref 6–24)
Bilirubin Total: 0.3 mg/dL (ref 0.0–1.2)
CO2: 25 mmol/L (ref 20–29)
Calcium: 9.3 mg/dL (ref 8.7–10.2)
Chloride: 103 mmol/L (ref 96–106)
Creatinine, Ser: 0.9 mg/dL (ref 0.57–1.00)
GFR calc Af Amer: 91 mL/min/{1.73_m2} (ref >59)
GFR calc non Af Amer: 79 mL/min/{1.73_m2} (ref >59)
Globulin, Total: 2.3 g/dL (ref 1.5–4.5)
Glucose: 63 mg/dL — ABNORMAL LOW (ref 65–99)
Potassium: 3.8 mmol/L (ref 3.5–5.2)
Sodium: 139 mmol/L (ref 134–144)
Total Protein: 7 g/dL (ref 6.0–8.5)

## 2019-11-25 LAB — CBC WITH DIFFERENTIAL/PLATELET
Basophils Absolute: 0 10*3/uL (ref 0.0–0.2)
Basos: 1 %
EOS (ABSOLUTE): 0.1 10*3/uL (ref 0.0–0.4)
Eos: 2 %
Hematocrit: 42.7 % (ref 34.0–46.6)
Hemoglobin: 14.3 g/dL (ref 11.1–15.9)
Immature Grans (Abs): 0 10*3/uL (ref 0.0–0.1)
Immature Granulocytes: 0 %
Lymphocytes Absolute: 1 10*3/uL (ref 0.7–3.1)
Lymphs: 21 %
MCH: 31 pg (ref 26.6–33.0)
MCHC: 33.5 g/dL (ref 31.5–35.7)
MCV: 93 fL (ref 79–97)
Monocytes Absolute: 0.5 10*3/uL (ref 0.1–0.9)
Monocytes: 10 %
Neutrophils Absolute: 3.3 10*3/uL (ref 1.4–7.0)
Neutrophils: 66 %
Platelets: 219 10*3/uL (ref 150–450)
RBC: 4.61 x10E6/uL (ref 3.77–5.28)
RDW: 12.4 % (ref 11.7–15.4)
WBC: 5 10*3/uL (ref 3.4–10.8)

## 2019-11-25 LAB — LIPID PANEL W/O CHOL/HDL RATIO
Cholesterol, Total: 177 mg/dL (ref 100–199)
HDL: 73 mg/dL (ref >39)
LDL Chol Calc (NIH): 86 mg/dL (ref 0–99)
Triglycerides: 99 mg/dL (ref 0–149)
VLDL Cholesterol Cal: 18 mg/dL (ref 5–40)

## 2019-11-25 LAB — HEPATITIS C ANTIBODY: Hep C Virus Ab: <0.1 s/co ratio (ref 0.0–0.9)

## 2019-11-25 LAB — TSH: TSH: 1.82 u[IU]/mL (ref 0.450–4.500)

## 2019-11-25 NOTE — Progress Notes (Signed)
Please let Terrea know her labs have returned and overall look great, no concerns except sugar was a little on lower side.  I will let her know when pap returns.  Have a great day!! Keep being awesome!!  Thank you for allowing me to participate in your care. Kindest regards, Dhairya Corales

## 2019-11-27 LAB — CYTOLOGY - PAP
Comment: NEGATIVE
Diagnosis: NEGATIVE
High risk HPV: NEGATIVE

## 2019-11-27 NOTE — Progress Notes (Signed)
Good morning, please let Wanda Blanchard know her pap returned negative HPV and negative for any abnormal cells.  Great news!!  We can repeat in 5 years.  Have a wonderful day. Keep being awesome!!  Thank you for allowing me to participate in your care. Kindest regards, Alyria Krack

## 2019-12-04 DIAGNOSIS — F3132 Bipolar disorder, current episode depressed, moderate: Secondary | ICD-10-CM | POA: Diagnosis not present

## 2019-12-16 DIAGNOSIS — M5412 Radiculopathy, cervical region: Secondary | ICD-10-CM | POA: Diagnosis not present

## 2019-12-16 DIAGNOSIS — M41117 Juvenile idiopathic scoliosis, lumbosacral region: Secondary | ICD-10-CM | POA: Diagnosis not present

## 2019-12-16 DIAGNOSIS — M9903 Segmental and somatic dysfunction of lumbar region: Secondary | ICD-10-CM | POA: Diagnosis not present

## 2019-12-16 DIAGNOSIS — M9905 Segmental and somatic dysfunction of pelvic region: Secondary | ICD-10-CM | POA: Diagnosis not present

## 2019-12-16 DIAGNOSIS — M5431 Sciatica, right side: Secondary | ICD-10-CM | POA: Diagnosis not present

## 2019-12-16 DIAGNOSIS — M9901 Segmental and somatic dysfunction of cervical region: Secondary | ICD-10-CM | POA: Diagnosis not present

## 2020-01-09 DIAGNOSIS — F3132 Bipolar disorder, current episode depressed, moderate: Secondary | ICD-10-CM | POA: Diagnosis not present

## 2020-01-13 DIAGNOSIS — M5412 Radiculopathy, cervical region: Secondary | ICD-10-CM | POA: Diagnosis not present

## 2020-01-13 DIAGNOSIS — M9903 Segmental and somatic dysfunction of lumbar region: Secondary | ICD-10-CM | POA: Diagnosis not present

## 2020-01-13 DIAGNOSIS — M5431 Sciatica, right side: Secondary | ICD-10-CM | POA: Diagnosis not present

## 2020-01-13 DIAGNOSIS — M41117 Juvenile idiopathic scoliosis, lumbosacral region: Secondary | ICD-10-CM | POA: Diagnosis not present

## 2020-01-13 DIAGNOSIS — M9905 Segmental and somatic dysfunction of pelvic region: Secondary | ICD-10-CM | POA: Diagnosis not present

## 2020-01-13 DIAGNOSIS — M9901 Segmental and somatic dysfunction of cervical region: Secondary | ICD-10-CM | POA: Diagnosis not present

## 2020-01-29 ENCOUNTER — Other Ambulatory Visit: Payer: Self-pay | Admitting: Physical Medicine and Rehabilitation

## 2020-01-29 DIAGNOSIS — M5416 Radiculopathy, lumbar region: Secondary | ICD-10-CM

## 2020-02-05 ENCOUNTER — Ambulatory Visit
Admission: RE | Admit: 2020-02-05 | Discharge: 2020-02-05 | Disposition: A | Discharge status: Home / Self Care | Payer: Medicaid Other | Source: Ambulatory Visit | Attending: Physical Medicine and Rehabilitation | Admitting: Physical Medicine and Rehabilitation

## 2020-02-05 ENCOUNTER — Other Ambulatory Visit: Payer: Self-pay

## 2020-02-05 DIAGNOSIS — M5416 Radiculopathy, lumbar region: Secondary | ICD-10-CM | POA: Insufficient documentation

## 2020-02-05 DIAGNOSIS — M545 Low back pain, unspecified: Secondary | ICD-10-CM | POA: Diagnosis not present

## 2020-02-16 ENCOUNTER — Ambulatory Visit: Payer: Medicaid Other | Admitting: Nurse Practitioner

## 2020-02-16 DIAGNOSIS — M9903 Segmental and somatic dysfunction of lumbar region: Secondary | ICD-10-CM | POA: Diagnosis not present

## 2020-02-16 DIAGNOSIS — M5412 Radiculopathy, cervical region: Secondary | ICD-10-CM | POA: Diagnosis not present

## 2020-02-16 DIAGNOSIS — M9901 Segmental and somatic dysfunction of cervical region: Secondary | ICD-10-CM | POA: Diagnosis not present

## 2020-02-16 DIAGNOSIS — M9905 Segmental and somatic dysfunction of pelvic region: Secondary | ICD-10-CM | POA: Diagnosis not present

## 2020-02-16 DIAGNOSIS — M5431 Sciatica, right side: Secondary | ICD-10-CM | POA: Diagnosis not present

## 2020-02-16 DIAGNOSIS — M41117 Juvenile idiopathic scoliosis, lumbosacral region: Secondary | ICD-10-CM | POA: Diagnosis not present

## 2020-03-01 DIAGNOSIS — M5136 Other intervertebral disc degeneration, lumbar region: Secondary | ICD-10-CM | POA: Diagnosis not present

## 2020-03-01 DIAGNOSIS — M5416 Radiculopathy, lumbar region: Secondary | ICD-10-CM | POA: Diagnosis not present

## 2020-03-31 DIAGNOSIS — M41117 Juvenile idiopathic scoliosis, lumbosacral region: Secondary | ICD-10-CM | POA: Diagnosis not present

## 2020-03-31 DIAGNOSIS — M9901 Segmental and somatic dysfunction of cervical region: Secondary | ICD-10-CM | POA: Diagnosis not present

## 2020-03-31 DIAGNOSIS — M5431 Sciatica, right side: Secondary | ICD-10-CM | POA: Diagnosis not present

## 2020-03-31 DIAGNOSIS — M9903 Segmental and somatic dysfunction of lumbar region: Secondary | ICD-10-CM | POA: Diagnosis not present

## 2020-03-31 DIAGNOSIS — M9905 Segmental and somatic dysfunction of pelvic region: Secondary | ICD-10-CM | POA: Diagnosis not present

## 2020-03-31 DIAGNOSIS — M5412 Radiculopathy, cervical region: Secondary | ICD-10-CM | POA: Diagnosis not present

## 2020-04-05 DIAGNOSIS — M5442 Lumbago with sciatica, left side: Secondary | ICD-10-CM | POA: Diagnosis not present

## 2020-04-05 DIAGNOSIS — M5416 Radiculopathy, lumbar region: Secondary | ICD-10-CM | POA: Diagnosis not present

## 2020-04-05 DIAGNOSIS — M62830 Muscle spasm of back: Secondary | ICD-10-CM | POA: Diagnosis not present

## 2020-04-05 DIAGNOSIS — M5136 Other intervertebral disc degeneration, lumbar region: Secondary | ICD-10-CM | POA: Diagnosis not present

## 2020-04-09 DIAGNOSIS — F3132 Bipolar disorder, current episode depressed, moderate: Secondary | ICD-10-CM | POA: Diagnosis not present

## 2020-05-05 DIAGNOSIS — M5431 Sciatica, right side: Secondary | ICD-10-CM | POA: Diagnosis not present

## 2020-05-05 DIAGNOSIS — M9901 Segmental and somatic dysfunction of cervical region: Secondary | ICD-10-CM | POA: Diagnosis not present

## 2020-05-05 DIAGNOSIS — M5412 Radiculopathy, cervical region: Secondary | ICD-10-CM | POA: Diagnosis not present

## 2020-05-05 DIAGNOSIS — M41117 Juvenile idiopathic scoliosis, lumbosacral region: Secondary | ICD-10-CM | POA: Diagnosis not present

## 2020-05-05 DIAGNOSIS — M9903 Segmental and somatic dysfunction of lumbar region: Secondary | ICD-10-CM | POA: Diagnosis not present

## 2020-05-05 DIAGNOSIS — M9905 Segmental and somatic dysfunction of pelvic region: Secondary | ICD-10-CM | POA: Diagnosis not present

## 2020-05-24 ENCOUNTER — Ambulatory Visit: Payer: Medicaid Other | Admitting: Nurse Practitioner

## 2020-06-07 ENCOUNTER — Ambulatory Visit: Payer: Medicaid Other | Admitting: Nurse Practitioner

## 2020-06-08 ENCOUNTER — Ambulatory Visit: Payer: Medicaid Other | Admitting: Nurse Practitioner

## 2020-06-08 ENCOUNTER — Encounter: Payer: Self-pay | Admitting: Nurse Practitioner

## 2020-06-08 ENCOUNTER — Other Ambulatory Visit: Payer: Self-pay

## 2020-06-08 DIAGNOSIS — F319 Bipolar disorder, unspecified: Secondary | ICD-10-CM | POA: Diagnosis not present

## 2020-06-08 DIAGNOSIS — M797 Fibromyalgia: Secondary | ICD-10-CM | POA: Diagnosis not present

## 2020-06-08 DIAGNOSIS — J301 Allergic rhinitis due to pollen: Secondary | ICD-10-CM

## 2020-06-08 DIAGNOSIS — F419 Anxiety disorder, unspecified: Secondary | ICD-10-CM | POA: Diagnosis not present

## 2020-06-08 DIAGNOSIS — J309 Allergic rhinitis, unspecified: Secondary | ICD-10-CM | POA: Insufficient documentation

## 2020-06-08 NOTE — Assessment & Plan Note (Addendum)
Chronic, ongoing.  Continue current medication regimen, Klonopin, and collaboration with psychiatry.

## 2020-06-08 NOTE — Progress Notes (Signed)
BP 101/70   Pulse 82   Temp 99.1 F (37.3 C) (Oral)   Wt 111 lb 6.4 oz (50.5 kg)   SpO2 98%   BMI 17.45 kg/m    Subjective:    Patient ID: Wanda Blanchard, female    DOB: April 03, 1975, 45 y.o.   MRN: [id number]  HPI: Wanda Blanchard is a 45 y.o. female  Chief Complaint  Patient presents with  . Mood     Patient states her prescription was changed from Seroquel to Seroquel XR 50 mg at bedtime. prescription and states that it helps with her mood more. Patient states the medication also helps a little with her fibromyalgia.   . Fibromyalgia    Patient states she notices that she notices more when she is more active or have a hard day at work or yard work is when she feels the pain and states she just works through it.   . Allergic Rhinitis     Patient states back in March she was having "some noises in her ear when it sounded like swooshing of fluids in her ear and could hear every heartbeat." Patient states she thinks it may have been allergy related because she took a allergy medication and it helped and has since gotten better, but would like for her ears to be checked.    BIPOLAR I Is being followed by psychiatry, saw last in March 2022.  Diagnosed with Bipolar I.  Changed to Seroquel XR in November 2021 and continues Klonopin as needed.  She states they are going to keep the Klonopin for her anxiety.  She reports good experience with psychiatry team.   Duration:stable Anxious mood: yes Excessive worrying: no Irritability: no  Sweating: no Nausea: no Palpitations:no Hyperventilation: no Panic attacks: no Agoraphobia: no  Obscessions/compulsions: no Depressed mood: yes Depression screen [facility] 2/9 06/08/2020 05/19/2019 11/26/2018 08/26/2018 07/15/2018  Decreased Interest 1 1 1 1 2   Down, Depressed, Hopeless 1 1 1 2 2   PHQ - 2 Score 2 2 2 3 4   Altered sleeping 2 1 1 2 3   Tired, decreased energy 2 1 1 1 2   Change in appetite 3 3 1 3 3   Feeling bad or failure about yourself  1 1 1 1  1   Trouble concentrating 0 1 0 1 2  Moving slowly or fidgety/restless 0 0 1 1 3   Suicidal thoughts 0 0 2 1 1   PHQ-9 Score 10 9 9 13 19   Difficult doing work/chores - Somewhat difficult Somewhat difficult Somewhat difficult Very difficult  Some recent data might be hidden   Anhedonia: no Weight changes: no Insomnia: yes hard to fall asleep  Hypersomnia: no Fatigue/loss of energy: yes Feelings of worthlessness: no Feelings of guilt: no Impaired concentration/indecisiveness: yes Suicidal ideations: no  Crying spells: no Recent Stressors/Life Changes: no   Relationship problems: no   Family stress: no     Financial stress: no    Job stress: no    Recent death/loss: no GAD 7 : Generalized Anxiety Score 06/08/2020 05/19/2019 11/26/2018 08/26/2018  Nervous, Anxious, on Edge 1 1 1 1   Control/stop worrying 1 2 2 1   Worry too much - different things 1 2 1 1   Trouble relaxing 0 1 1 3   Restless 1 0 1 1  Easily annoyed or irritable 0 2 2 3   Afraid - awful might happen 0 1 3 2   Total GAD 7 Score 4 9 11 12   Anxiety Difficulty - Somewhat difficult Not  difficult at all Somewhat difficult    FIBROMYALGIA: Initial diagnosis in 2012.  Feels less senseless pain with mood more stable. Is able to function on a daily basis with Robaxin and occasional Tramadol, although reports she has not been using it as often due to concerns for addiction.  On review of database, she has not refilled since March 2020 -- #28 pills at that time, she minimally uses.  Has not used in long while. Is seeing [facility] and receiving injections, last visit was 04/05/20 -- had repeat MRI which showed no worsening. Has been going to chiropractor.  She reports she does not need refills on Tramadol today.  ALLERGIES Around January and February would hear thumping in ear and felt like had fluid in ears == some discomfort with this and nasal congestion.  She tried OTC medications and this cleared it up.  She would like  ears looked at.  At this time no longer needs OTC medications. Duration: months Symptoms occur seasonally: no Symptoms occur perenially: no Satisfied with current treatment: yes Allergist evaluation in past: no Allergen injection immunotherapy: no Recurrent sinus infections: no ENT evaluation in past: no Known environmental allergy: no Indoor pets: yes History of asthma: no Current allergy medications: Dayquil Treatments attempted: Dayquil  Relevant past medical, surgical, family and social history reviewed and updated as indicated. Interim medical history since our last visit reviewed. Allergies and medications reviewed and updated.  Review of Systems  Constitutional: Negative for activity change, appetite change, diaphoresis, fatigue and fever.  Respiratory: Negative for cough, chest tightness and shortness of breath.   Cardiovascular: Negative for chest pain, palpitations and leg swelling.  Gastrointestinal: Negative for abdominal distention, abdominal pain, constipation, diarrhea, nausea and vomiting.  Neurological: Negative for dizziness, syncope, weakness, light-headedness, numbness and headaches.  Psychiatric/Behavioral: Positive for decreased concentration and sleep disturbance. Negative for self-injury and suicidal ideas. The patient is nervous/anxious.     Per HPI unless specifically indicated above     Objective:    BP 101/70   Pulse 82   Temp 99.1 F (37.3 C) (Oral)   Wt 111 lb 6.4 oz (50.5 kg)   SpO2 98%   BMI 17.45 kg/m   Wt Readings from Last 3 Encounters:  06/08/20 111 lb 6.4 oz (50.5 kg)  11/24/19 117 lb (53.1 kg)  05/19/19 117 lb 3.2 oz (53.2 kg)    Physical Exam Vitals and nursing note reviewed.  Constitutional:      General: She is awake. She is not in acute distress.    Appearance: She is well-developed. She is not ill-appearing.  HENT:     Head: Normocephalic.     Right Ear: Hearing normal.     Left Ear: Hearing normal.     Nose: Nose  normal.     Mouth/Throat:     Mouth: Mucous membranes are moist.  Eyes:     General: Lids are normal.        Right eye: No discharge.        Left eye: No discharge.     Conjunctiva/sclera: Conjunctivae normal.     Pupils: Pupils are equal, round, and reactive to light.  Neck:     Thyroid: No thyromegaly.     Vascular: No carotid bruit or JVD.  Cardiovascular:     Rate and Rhythm: Normal rate and regular rhythm.     Heart sounds: Normal heart sounds. No murmur heard. No gallop.   Pulmonary:     Effort: Pulmonary effort  is normal. No accessory muscle usage or respiratory distress.     Breath sounds: Normal breath sounds.  Abdominal:     General: Bowel sounds are normal.     Palpations: Abdomen is soft. There is no hepatomegaly or splenomegaly.  Musculoskeletal:     Cervical back: Normal range of motion and neck supple.     Right lower leg: No edema.     Left lower leg: No edema.  Lymphadenopathy:     Cervical: No cervical adenopathy.  Skin:    General: Skin is warm and dry.  Neurological:     Mental Status: She is alert and oriented to person, place, and time.  Psychiatric:        Attention and Perception: Attention normal.        Mood and Affect: Mood normal.        Behavior: Behavior normal. Behavior is cooperative.        Thought Content: Thought content normal.        Judgment: Judgment normal.     Results for orders placed or performed in visit on 11/24/19  CBC with Differential/Platelet  Result Value Ref Range   WBC 5.0 3.4 - 10.8 x10E3/uL   RBC 4.61 3.77 - 5.28 x10E6/uL   Hemoglobin 14.3 11.1 - 15.9 g/dL   Hematocrit 16.1 09.6 - 46.6 %   MCV 93 79 - 97 fL   MCH 31.0 26.6 - 33.0 pg   MCHC 33.5 31.5 - 35.7 g/dL   RDW 04.5 40.9 - 81.1 %   Platelets 219 150 - 450 x10E3/uL   Neutrophils 66 Not Estab. %   Lymphs 21 Not Estab. %   Monocytes 10 Not Estab. %   Eos 2 Not Estab. %   Basos 1 Not Estab. %   Neutrophils Absolute 3.3 1.4 - 7.0 x10E3/uL   Lymphocytes  Absolute 1.0 0.7 - 3.1 x10E3/uL   Monocytes Absolute 0.5 0.1 - 0.9 x10E3/uL   EOS (ABSOLUTE) 0.1 0.0 - 0.4 x10E3/uL   Basophils Absolute 0.0 0.0 - 0.2 x10E3/uL   Immature Granulocytes 0 Not Estab. %   Immature Grans (Abs) 0.0 0.0 - 0.1 x10E3/uL  Comprehensive metabolic panel  Result Value Ref Range   Glucose 63 (L) 65 - 99 mg/dL   BUN 10 6 - 24 mg/dL   Creatinine, Ser 9.14 0.57 - 1.00 mg/dL   GFR calc non Af Amer 79 >59 mL/min/1.73   GFR calc Af Amer 91 >59 mL/min/1.73   BUN/Creatinine Ratio 11 9 - 23   Sodium 139 134 - 144 mmol/L   Potassium 3.8 3.5 - 5.2 mmol/L   Chloride 103 96 - 106 mmol/L   CO2 25 20 - 29 mmol/L   Calcium 9.3 8.7 - 10.2 mg/dL   Total Protein 7.0 6.0 - 8.5 g/dL   Albumin 4.7 3.8 - 4.8 g/dL   Globulin, Total 2.3 1.5 - 4.5 g/dL   Albumin/Globulin Ratio 2.0 1.2 - 2.2   Bilirubin Total 0.3 0.0 - 1.2 mg/dL   Alkaline Phosphatase 61 44 - 121 IU/L   AST 11 0 - 40 IU/L   ALT 9 0 - 32 IU/L  Lipid Panel w/o Chol/HDL Ratio  Result Value Ref Range   Cholesterol, Total 177 100 - 199 mg/dL   Triglycerides 99 0 - 149 mg/dL   HDL 73 >78 mg/dL   VLDL Cholesterol Cal 18 5 - 40 mg/dL   LDL Chol Calc (NIH) 86 0 - 99 mg/dL  TSH  Result Value  Ref Range   TSH 1.820 0.450 - 4.500 uIU/mL  Hepatitis C antibody  Result Value Ref Range   Hep C Virus Ab <0.1 0.0 - 0.9 s/co ratio  Cytology - PAP  Result Value Ref Range   High risk HPV Negative    Adequacy      Satisfactory for evaluation; transformation zone component PRESENT.   Diagnosis      - Negative for intraepithelial lesion or malignancy (NILM)   Comment Normal Reference Range HPV - Negative       Assessment & Plan:   Problem List Items Addressed This Visit      Respiratory   Allergic rhinitis    Stable at this time, no medications needed.  Continue to monitor.        Other   Bipolar I disorder (HCC)    Chronic, ongoing.  Denies SI/HI.  Continue current medication regimen, including Latuda, plus continue  collaboration with psychiatry.  Return in 6 months for follow-up and physical.      Anxiety    Chronic, ongoing.  Continue current medication regimen, Klonopin, and collaboration with psychiatry.        Fibromyalgia    Chronic, ongoing.  Continue current medication regimen.  [facility]  Does not need refills on Tramadol today.      Relevant Medications   methocarbamol (ROBAXIN) 750 MG tablet       Follow up plan: Return in about 6 months (around 12/09/2020) for Annual physical.

## 2020-06-08 NOTE — Assessment & Plan Note (Signed)
Chronic, ongoing.  Denies SI/HI.  Continue current medication regimen, including Latuda, plus continue collaboration with psychiatry.  Return in 6 months for follow-up and physical.

## 2020-06-08 NOTE — Assessment & Plan Note (Signed)
Chronic, ongoing.  Continue current medication regimen.  Continue collaboration visits with [facility].  Does not need refills on Tramadol today.

## 2020-06-08 NOTE — Assessment & Plan Note (Signed)
Stable at this time, no medications needed.  Continue to monitor.

## 2020-06-08 NOTE — Patient Instructions (Signed)

## 2020-07-02 ENCOUNTER — Telehealth: Payer: Self-pay | Admitting: Nurse Practitioner

## 2020-07-02 NOTE — Telephone Encounter (Signed)
Patient called in about getting set up with birth control, due to irregular periods, patient sates she has already spoke with PCP about this, and states PCP did advise her to try it. Please advise   Pharmacy: [phone] - [place], [place] - [address]  [address], [place]  [phone]

## 2020-07-02 NOTE — Telephone Encounter (Signed)
Please advise 

## 2020-07-02 NOTE — Telephone Encounter (Signed)
Call Tuesday and alert her she will need appointment to start this, as needs labs and urine first.

## 2020-07-06 NOTE — Telephone Encounter (Signed)
Called pt scheduled 6/7

## 2020-07-13 ENCOUNTER — Ambulatory Visit: Payer: Medicaid Other | Admitting: Nurse Practitioner

## 2020-07-13 ENCOUNTER — Other Ambulatory Visit: Payer: Self-pay

## 2020-07-13 ENCOUNTER — Encounter: Payer: Self-pay | Admitting: Nurse Practitioner

## 2020-07-13 VITALS — BP 106/72 | HR 79 | Temp 99.0°F | Wt 113.6 lb

## 2020-07-13 DIAGNOSIS — N926 Irregular menstruation, unspecified: Secondary | ICD-10-CM | POA: Insufficient documentation

## 2020-07-13 DIAGNOSIS — Z3009 Encounter for other general counseling and advice on contraception: Secondary | ICD-10-CM

## 2020-07-13 LAB — PREGNANCY, URINE: Preg Test, Ur: NEGATIVE

## 2020-07-13 MED ORDER — NORETHIN ACE-ETH ESTRAD-FE 1-20 MG-MCG(24) PO TABS: 1.0000 | ORAL_TABLET | Freq: Every day | ORAL | 11 refills | Status: DC

## 2020-07-13 NOTE — Progress Notes (Signed)
BP 106/72   Pulse 79   Temp 99 F (37.2 C) (Oral)   Wt 113 lb 9.6 oz (51.5 kg)   LMP 07/09/2020 (Exact Date)   SpO2 98%   BMI 17.79 kg/m    Subjective:    Patient ID: Wanda Blanchard, female    DOB: 1975-09-01, 45 y.o.   MRN: [id number]  HPI: Wanda Blanchard is a 45 y.o. female  Chief Complaint  Patient presents with  . Contraception    Pt states she would like to discuss starting on birth control for menstrual cycles. States sometimes she has light ones that last for 3 weeks, and then very heavy ones that last only a few days    IRREGULAR CYCLES: Having irregular cycles == some lasting 3 weeks with light flow and then some heavier cycles lasting 3 days.  This has been an issues for 1 to 1 1/2 years.  Uses Super Plus tampon and pads for heavy flow, if working and can not get to restroom, has to double up.  In a few hours she will have soaked through that and a pad.  Feels uncomfortable with this.  Current cycle started Friday night and having cramps -- heavier flow for past 4 days.  Prior to this the cycle had been a light flow and could go all day without a pad + had no cramps.  G 2 P 2, first at age 4 yo vaginal, 2nd was emergency c-section at age 73 years old.  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    In past took LoEstrin which offered benefit when younger.  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    Patient wishes to start  birth control.  She denies any history of DVT/PE, migraine with aura (no aura with her headaches), cancer, blood clotting disorder or tobacco use.   Relevant past medical, surgical, family and social history reviewed and updated as indicated. Interim medical history since our last visit reviewed. Allergies and medications reviewed and updated.  Review of Systems  Constitutional: Negative for activity change, appetite change, diaphoresis, fatigue and fever.  Respiratory: Negative  for cough, chest tightness and shortness of breath.   Cardiovascular: Negative for chest pain, palpitations and leg swelling.  Gastrointestinal: Negative for abdominal distention, abdominal pain, constipation, diarrhea, nausea and vomiting.  Neurological: Negative for dizziness, syncope, weakness, light-headedness, numbness and headaches.  Psychiatric/Behavioral: Negative.     Per HPI unless specifically indicated above     Objective:    BP 106/72   Pulse 79   Temp 99 F (37.2 C) (Oral)   Wt 113 lb 9.6 oz (51.5 kg)   LMP 07/09/2020 (Exact Date)   SpO2 98%   BMI 17.79 kg/m   Wt Readings from Last 3 Encounters:  07/13/20 113 lb 9.6 oz (51.5 kg)  06/08/20 111 lb 6.4 oz (50.5 kg)  11/24/19 117 lb (53.1 kg)    Physical Exam Vitals and nursing note reviewed.  Constitutional:      General: She is awake. She is not in acute distress.    Appearance: She is well-developed. She is not ill-appearing.  HENT:     Head: Normocephalic.     Right Ear: Hearing normal.     Left Ear: Hearing normal.     Nose: Nose normal.     Mouth/Throat:     Mouth: Mucous membranes are moist.  Eyes:  General: Lids are normal.        Right eye: No discharge.        Left eye: No discharge.     Conjunctiva/sclera: Conjunctivae normal.     Pupils: Pupils are equal, round, and reactive to light.  Neck:     Thyroid: No thyromegaly.     Vascular: No carotid bruit or JVD.  Cardiovascular:     Rate and Rhythm: Normal rate and regular rhythm.     Heart sounds: Normal heart sounds. No murmur heard. No gallop.   Pulmonary:     Effort: Pulmonary effort is normal. No accessory muscle usage or respiratory distress.     Breath sounds: Normal breath sounds.  Abdominal:     General: Bowel sounds are normal.     Palpations: Abdomen is soft. There is no hepatomegaly or splenomegaly.  Musculoskeletal:     Cervical back: Normal range of motion and neck supple.     Right lower leg: No edema.     Left lower leg:  No edema.  Lymphadenopathy:     Cervical: No cervical adenopathy.  Skin:    General: Skin is warm and dry.  Neurological:     Mental Status: She is alert and oriented to person, place, and time.  Psychiatric:        Attention and Perception: Attention normal.        Mood and Affect: Mood normal.        Behavior: Behavior normal. Behavior is cooperative.        Thought Content: Thought content normal.        Judgment: Judgment normal.    Results for orders placed or performed in visit on 07/13/20  Pregnancy, urine  Result Value Ref Range   Preg Test, Ur Negative Negative      Assessment & Plan:   Problem List Items Addressed This Visit      Other   Irregular menstrual cycle - Primary    Patient wishes to start  birth control for irregular cycles.  She denies any history of DVT/PE, migraine with aura, cancer, blood clotting disorder or tobacco use.  Instructed to start after her next menstrual period, or right away if appropriate.  Instructed as to importance of taking medication as directed, and encouraged to use condoms as well if at risk for STIs.  Script sent in for Lo Estrin which she has tolerate in past, start at lower dosing.  Discussed all birth control options with her and she prefers to start with pill.  Pregnancy testing negative today.  Return in 6 weeks.       Other Visit Diagnoses    Birth control counseling       Refer to irregular cycles.   Relevant Orders   Pregnancy, urine (Completed)       Follow up plan: Return in about 6 weeks (around 08/24/2020) for Irregular cycles.

## 2020-07-13 NOTE — Patient Instructions (Signed)
Starting Your Oral Contraception:  1) First Day Start - Take your first pill during the first 24 hours of your menstrual cycle. No back-up contraceptivemethod is needed when the pill is started the first day of your menses.  2) Sunday Start - Wait until the first Sunday after your menstrual cycle begins to take your first pill. With this optionuse another method of birth control for the first 7 days of the first cycle only.  3) "Quick Start" - Start the pill today. If you have had unprotected sexual intercourse since your last period, perform a pregnancy test prior to starting the pill. If it is negative, start the pill today. Use another method of birth control such as condoms or spermicide the first seven days of the first cycle of use.  Oral Contraception Answers to Common Questions.   1)  Take your birth control pill at the same time every day. This ensures that a constant hormone level is maintained at all times.  2) Should you experience nausea or vomiting, try taking your pill after a meal or at bedtime.  3)  Irregular vaginal bleeding or spotting may occur while you are taking the pills, especially during the first few months of oral contraceptive use. If you experience spotting or break-through bleeding, (bleeding that occurs outside of the placebo week) that occurs after the first 3 cycles, or lasts for more than a few days, see your health care provider.  4) Birth control pills do not protect you from sexually transmitted infections.  INSTRUCTIONS FOR MISSED PILLS: 1 active pill < 24 hours late in any week: Take 1 active pill ASAP* and continue pack as usual.  Missed 1 or more active pills (i.e., >24 hours late): If during week 1: Take 1 active pill ASAP* and continue pack as usual. Back-up contraception for 7 days. Consider Emergency Contraception (EC) if unprotected intercourse occurred within the  5 days prior to missing pill.  If during week 2 or 3 and missed < 3  pills: Take 1 active pill ASAP* and continue active pills as usual, but discard placebo pills and start a new pack  If during week 2 or 3 and ? 3 pills missed: Take 1 active pill ASAP* and continue active pills as usual, but discard placebo pills and start a new pack Back-up contraception for 7 days. Consider EC if repeated or prolonged omission, or if unprotected intercourse occurred during the time the pills were missed and up until seven active pills have been taken Instructions for missed extended or continuous hormonal contraceptives:  Missed pill after 21 consecutive days of extended or continuous use, up to 7 days can be missed.  If > 7 days missed, instructions would be the same as for cyclic users who have missed/delayed combined hormonal contraceptive in the first week of use.  When the extended/continuous regimen is resumed, recommendations for cyclic users for missed/delayed combined hormonal contraceptive during the first 21 consecutive days of use should be followed.  **If you still are not sure what to do about the pills you have missed, use a back-up method anytime you have sex. Keep taking one pill each day until you can reach your doctor or clinic.  MEDICATIONS AND BIRTH CONTROL PILLS: The following medications and supplements may interfere with the effectiveness of CHC:  some antibiotics  anticonvulsants  St. John's Wort  Provigil  It is recommend that if you take the above medications and supplements and are sexually active with a female partner,   you use a back-up method of birth control while using the medication and for 7 consecutive days once the medication is completed.  IF YOU EXPERIENCE ANY OF THE FOLLOWING, PLEASE CALL IMMEDIATELY OR GO TO THE EMERGENCY ROOM:  severe abdominal pain or tenderness in the lower abdomen chest pain, sharp, sudden shortness of breath or coughing up blood headache, severe and sudden, or vomiting, dizziness or faintness eyesight  problems, such as sudden blurred or doubled vision or flashes of light severe pain or swelling in calf or groin  

## 2020-07-13 NOTE — Assessment & Plan Note (Signed)
Patient wishes to start  birth control for irregular cycles.  She denies any history of DVT/PE, migraine with aura, cancer, blood clotting disorder or tobacco use.  Instructed to start after her next menstrual period, or right away if appropriate.  Instructed as to importance of taking medication as directed, and encouraged to use condoms as well if at risk for STIs.  Script sent in for Lo Estrin which she has tolerate in past, start at lower dosing.  Discussed all birth control options with her and she prefers to start with pill.  Pregnancy testing negative today.  Return in 6 weeks.

## 2020-07-15 DIAGNOSIS — F3132 Bipolar disorder, current episode depressed, moderate: Secondary | ICD-10-CM | POA: Diagnosis not present

## 2020-07-19 DIAGNOSIS — M9901 Segmental and somatic dysfunction of cervical region: Secondary | ICD-10-CM | POA: Diagnosis not present

## 2020-07-19 DIAGNOSIS — M41117 Juvenile idiopathic scoliosis, lumbosacral region: Secondary | ICD-10-CM | POA: Diagnosis not present

## 2020-07-19 DIAGNOSIS — M5412 Radiculopathy, cervical region: Secondary | ICD-10-CM | POA: Diagnosis not present

## 2020-07-19 DIAGNOSIS — M9903 Segmental and somatic dysfunction of lumbar region: Secondary | ICD-10-CM | POA: Diagnosis not present

## 2020-07-19 DIAGNOSIS — M9905 Segmental and somatic dysfunction of pelvic region: Secondary | ICD-10-CM | POA: Diagnosis not present

## 2020-07-19 DIAGNOSIS — M5431 Sciatica, right side: Secondary | ICD-10-CM | POA: Diagnosis not present

## 2020-08-16 DIAGNOSIS — M5412 Radiculopathy, cervical region: Secondary | ICD-10-CM | POA: Diagnosis not present

## 2020-08-16 DIAGNOSIS — M9901 Segmental and somatic dysfunction of cervical region: Secondary | ICD-10-CM | POA: Diagnosis not present

## 2020-08-16 DIAGNOSIS — M9905 Segmental and somatic dysfunction of pelvic region: Secondary | ICD-10-CM | POA: Diagnosis not present

## 2020-08-16 DIAGNOSIS — M9903 Segmental and somatic dysfunction of lumbar region: Secondary | ICD-10-CM | POA: Diagnosis not present

## 2020-08-16 DIAGNOSIS — M41117 Juvenile idiopathic scoliosis, lumbosacral region: Secondary | ICD-10-CM | POA: Diagnosis not present

## 2020-08-16 DIAGNOSIS — M5431 Sciatica, right side: Secondary | ICD-10-CM | POA: Diagnosis not present

## 2020-08-30 ENCOUNTER — Encounter: Payer: Self-pay | Admitting: Nurse Practitioner

## 2020-08-30 ENCOUNTER — Other Ambulatory Visit: Payer: Self-pay

## 2020-08-30 ENCOUNTER — Ambulatory Visit: Payer: Medicaid Other | Admitting: Nurse Practitioner

## 2020-08-30 VITALS — BP 94/64 | HR 92 | Temp 99.1°F | Wt 112.8 lb

## 2020-08-30 DIAGNOSIS — N926 Irregular menstruation, unspecified: Secondary | ICD-10-CM

## 2020-08-30 MED ORDER — NORETHIN ACE-ETH ESTRAD-FE 1-20 MG-MCG(24) PO TABS: 1.0000 | ORAL_TABLET | Freq: Every day | ORAL | 11 refills | Status: DC

## 2020-08-30 NOTE — Patient Instructions (Signed)

## 2020-08-30 NOTE — Progress Notes (Signed)
BP 94/64   Pulse 92   Temp 99.1 F (37.3 C) (Oral)   Wt 112 lb 12.8 oz (51.2 kg)   SpO2 98%   BMI 17.67 kg/m    Subjective:    Patient ID: Wanda Blanchard, female    DOB: October 10, 1975, 45 y.o.   MRN: [id number]  HPI: Wanda Blanchard is a 45 y.o. female  Chief Complaint  Patient presents with   Menstrual Problem    Patient states the birth control is helping, but she is experiencing breast tenderness and soreness. Patient states the birth control has had any adverse reactions as of yet with medication. Patient states it is almost time for her cycle to start.    IRREGULAR CYCLES: Follow-up for irregular cycles and heavy flow, started on BCP (LoEstrin) 07/13/20.  G 2 P 2, first at age 58 yo vaginal, 2nd was emergency c-section at age 62 years old.  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  She reports improvement in cycles at this time -- more regular and not as heavy.   In past took LoEstrin which offered benefit when younger.  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    Patient wishes to continue birth control as offering benefit, only side effect is breat tenderness which she reports she can tolerate.  She denies any history of DVT/PE, migraine with aura (no aura with her headaches), cancer, blood clotting disorder or tobacco use.    Relevant past medical, surgical, family and social history reviewed and updated as indicated. Interim medical history since our last visit reviewed.  Allergies and medications reviewed and updated.  Review of Systems  Constitutional:  Negative for activity change, appetite change, diaphoresis, fatigue and fever.  Respiratory:  Negative for cough, chest tightness and shortness of breath.   Cardiovascular:  Negative for chest pain, palpitations and leg swelling.  Gastrointestinal:  Negative for abdominal distention, abdominal pain, constipation, diarrhea, nausea and vomiting.   Neurological:  Negative for dizziness, syncope, weakness, light-headedness, numbness and headaches.  Psychiatric/Behavioral: Negative.     Per HPI unless specifically indicated above     Objective:    BP 94/64   Pulse 92   Temp 99.1 F (37.3 C) (Oral)   Wt 112 lb 12.8 oz (51.2 kg)   SpO2 98%   BMI 17.67 kg/m   Wt Readings from Last 3 Encounters:  08/30/20 112 lb 12.8 oz (51.2 kg)  07/13/20 113 lb 9.6 oz (51.5 kg)  06/08/20 111 lb 6.4 oz (50.5 kg)    Physical Exam Vitals and nursing note reviewed.  Constitutional:      General: She is awake. She is not in acute distress.    Appearance: She is well-developed. She is not ill-appearing.  HENT:     Head: Normocephalic.     Right Ear: Hearing normal.     Left Ear: Hearing normal.     Nose: Nose normal.     Mouth/Throat:     Mouth: Mucous membranes are moist.  Eyes:     General: Lids are normal.        Right eye: No discharge.        Left eye: No discharge.     Conjunctiva/sclera: Conjunctivae normal.     Pupils: Pupils are equal, round, and reactive to light.  Neck:     Thyroid: No thyromegaly.     Vascular: No carotid bruit or JVD.  Cardiovascular:     Rate and Rhythm: Normal rate and regular rhythm.     Heart sounds: Normal heart sounds. No murmur heard.   No gallop.  Pulmonary:     Effort: Pulmonary effort is normal. No accessory muscle usage or respiratory distress.     Breath sounds: Normal breath sounds.  Abdominal:     General: Bowel sounds are normal.     Palpations: Abdomen is soft. There is no hepatomegaly or splenomegaly.  Musculoskeletal:     Cervical back: Normal range of motion and neck supple.     Right lower leg: No edema.     Left lower leg: No edema.  Lymphadenopathy:     Cervical: No cervical adenopathy.  Skin:    General: Skin is warm and dry.  Neurological:     Mental Status: She is alert and oriented to person, place, and time.  Psychiatric:        Attention and Perception: Attention  normal.        Mood and Affect: Mood normal.        Behavior: Behavior normal. Behavior is cooperative.        Thought Content: Thought content normal.        Judgment: Judgment normal.   Results for orders placed or performed in visit on 07/13/20  Pregnancy, urine  Result Value Ref Range   Preg Test, Ur Negative Negative      Assessment & Plan:   Problem List Items Addressed This Visit       Other   Irregular menstrual cycle - Primary    Patient wishes to continue birth control for irregular and heavy cycles, as is offering benefit at this time.  She denies any history of DVT/PE, migraine with aura, cancer, blood clotting disorder or tobacco use.  Continue Lo Estrin, refills sent in. Return in October for physical.         Follow up plan: Return in about 3 months (around 11/24/2020) for Annual physical.

## 2020-08-30 NOTE — Assessment & Plan Note (Signed)
Patient wishes to continue birth control for irregular and heavy cycles, as is offering benefit at this time.  She denies any history of DVT/PE, migraine with aura, cancer, blood clotting disorder or tobacco use.  Continue Lo Estrin, refills sent in. Return in October for physical.

## 2020-09-13 DIAGNOSIS — M9905 Segmental and somatic dysfunction of pelvic region: Secondary | ICD-10-CM | POA: Diagnosis not present

## 2020-09-13 DIAGNOSIS — M9903 Segmental and somatic dysfunction of lumbar region: Secondary | ICD-10-CM | POA: Diagnosis not present

## 2020-09-13 DIAGNOSIS — M5412 Radiculopathy, cervical region: Secondary | ICD-10-CM | POA: Diagnosis not present

## 2020-09-13 DIAGNOSIS — M9901 Segmental and somatic dysfunction of cervical region: Secondary | ICD-10-CM | POA: Diagnosis not present

## 2020-09-13 DIAGNOSIS — M5431 Sciatica, right side: Secondary | ICD-10-CM | POA: Diagnosis not present

## 2020-09-13 DIAGNOSIS — M41117 Juvenile idiopathic scoliosis, lumbosacral region: Secondary | ICD-10-CM | POA: Diagnosis not present

## 2020-10-07 DIAGNOSIS — F3132 Bipolar disorder, current episode depressed, moderate: Secondary | ICD-10-CM | POA: Diagnosis not present

## 2020-10-07 DIAGNOSIS — Z79899 Other long term (current) drug therapy: Secondary | ICD-10-CM | POA: Diagnosis not present

## 2020-11-25 DIAGNOSIS — F3170 Bipolar disorder, currently in remission, most recent episode unspecified: Secondary | ICD-10-CM | POA: Diagnosis not present

## 2020-11-29 ENCOUNTER — Encounter: Payer: Medicaid Other | Admitting: Nurse Practitioner

## 2020-12-01 DIAGNOSIS — F3170 Bipolar disorder, currently in remission, most recent episode unspecified: Secondary | ICD-10-CM | POA: Diagnosis not present

## 2020-12-08 ENCOUNTER — Telehealth: Payer: Medicaid Other | Admitting: Physician Assistant

## 2020-12-08 DIAGNOSIS — R3989 Other symptoms and signs involving the genitourinary system: Secondary | ICD-10-CM | POA: Diagnosis not present

## 2020-12-08 MED ORDER — CEPHALEXIN 500 MG PO CAPS: 500.0000 mg | ORAL_CAPSULE | Freq: Two times a day (BID) | ORAL | 0 refills | Status: DC

## 2020-12-08 NOTE — Progress Notes (Signed)

## 2020-12-10 DIAGNOSIS — F3170 Bipolar disorder, currently in remission, most recent episode unspecified: Secondary | ICD-10-CM | POA: Diagnosis not present

## 2020-12-17 DIAGNOSIS — F3170 Bipolar disorder, currently in remission, most recent episode unspecified: Secondary | ICD-10-CM | POA: Diagnosis not present

## 2020-12-24 DIAGNOSIS — M5431 Sciatica, right side: Secondary | ICD-10-CM | POA: Diagnosis not present

## 2020-12-24 DIAGNOSIS — M9901 Segmental and somatic dysfunction of cervical region: Secondary | ICD-10-CM | POA: Diagnosis not present

## 2020-12-24 DIAGNOSIS — F3170 Bipolar disorder, currently in remission, most recent episode unspecified: Secondary | ICD-10-CM | POA: Diagnosis not present

## 2020-12-24 DIAGNOSIS — M41117 Juvenile idiopathic scoliosis, lumbosacral region: Secondary | ICD-10-CM | POA: Diagnosis not present

## 2020-12-24 DIAGNOSIS — M9905 Segmental and somatic dysfunction of pelvic region: Secondary | ICD-10-CM | POA: Diagnosis not present

## 2020-12-24 DIAGNOSIS — M9903 Segmental and somatic dysfunction of lumbar region: Secondary | ICD-10-CM | POA: Diagnosis not present

## 2020-12-24 DIAGNOSIS — M5412 Radiculopathy, cervical region: Secondary | ICD-10-CM | POA: Diagnosis not present

## 2021-01-03 ENCOUNTER — Encounter: Payer: Self-pay | Admitting: Nurse Practitioner

## 2021-01-03 ENCOUNTER — Ambulatory Visit (INDEPENDENT_AMBULATORY_CARE_PROVIDER_SITE_OTHER): Payer: Medicaid Other | Admitting: Nurse Practitioner

## 2021-01-03 ENCOUNTER — Other Ambulatory Visit: Payer: Self-pay

## 2021-01-03 VITALS — BP 105/70 | HR 89 | Temp 99.2°F | Ht 67.5 in | Wt 110.2 lb

## 2021-01-03 DIAGNOSIS — F319 Bipolar disorder, unspecified: Secondary | ICD-10-CM

## 2021-01-03 DIAGNOSIS — Z136 Encounter for screening for cardiovascular disorders: Secondary | ICD-10-CM | POA: Diagnosis not present

## 2021-01-03 DIAGNOSIS — E538 Deficiency of other specified B group vitamins: Secondary | ICD-10-CM | POA: Diagnosis not present

## 2021-01-03 DIAGNOSIS — F419 Anxiety disorder, unspecified: Secondary | ICD-10-CM

## 2021-01-03 DIAGNOSIS — N926 Irregular menstruation, unspecified: Secondary | ICD-10-CM

## 2021-01-03 DIAGNOSIS — Z0000 Encounter for general adult medical examination without abnormal findings: Secondary | ICD-10-CM | POA: Diagnosis not present

## 2021-01-03 DIAGNOSIS — M797 Fibromyalgia: Secondary | ICD-10-CM

## 2021-01-03 DIAGNOSIS — E559 Vitamin D deficiency, unspecified: Secondary | ICD-10-CM

## 2021-01-03 NOTE — Assessment & Plan Note (Signed)
Chronic ongoing, experiences pain on a regular basis, does yoga stretches, alternate with heat and cold therapy which helps. Does monthly massages that also helps. Stated coworker told her about finding out if she has RA. ANA with reflex ordered today. Continue medication regimen. Follows up with rheumatology

## 2021-01-03 NOTE — Assessment & Plan Note (Signed)
Chronic ongoing, stable on medication, seen by psych. Started seeing a therapist in October 2022 which is helpful and has been going well. No issues today. Follow up in 6 months

## 2021-01-03 NOTE — Assessment & Plan Note (Signed)
Chronic ongoing, continue medication regimen. Follow up in 6 months

## 2021-01-03 NOTE — Progress Notes (Signed)
BP 105/70   Pulse 89   Temp 99.2 F (37.3 C) (Oral)   Ht 5' 7.5" (1.715 m)   Wt 110 lb 3.2 oz (50 kg)   SpO2 97%   BMI 17.01 kg/m    Subjective:    Patient ID: Wanda Blanchard, female    DOB: 07-04-1975, 45 y.o.   MRN: [id number]  NOTE WRITTEN BY [employer] DNP STUDENT.  ASSESSMENT AND PLAN OF CARE REVIEWED WITH STUDENT, AGREE WITH ABOVE FINDINGS AND PLAN.   HPI: Wanda Blanchard is a 45 y.o. female presenting on 01/03/2021 for comprehensive medical examination.   Current medical complaints include: Chronic pain  She currently lives with: child Menopausal Symptoms: no  FIBROMYALGIA Chronic pain to joints which has been the same for several years, uses heat and cold therapy with ibuprofen and muscle relaxant with some relief. Stated coworker suggested that she ask about being checked out for RA. Wants to know cause of joint pain.  Is followed by Dr. Sharlet Salina at [facility].   Pain status: stable Satisfied with current treatment?: yes Medication side effects: no Medication compliance: good compliance Duration:  Location:  Quality: aching Current pain level: mild Aggravating factors: none Alleviating factors: ice, heat, NSAIDs, and muscle relaxer Non-narcotic analgesic meds: yes Narcotic contract:yes Treatments attempted: ice, heat, and ibuprofen    ANXIETY/STRESS Followed by psychiatry and receives all medications through them. Duration:controlled Anxious mood: no  Excessive worrying: no Irritability: no  Sweating: no Nausea: no Palpitations:yes, occasionally has been going on for years, once she takes some deep breath and relaxes, it goes away Hyperventilation: no Panic attacks: no Agoraphobia: no  Obscessions/compulsions: no Depressed mood: no Depression screen [facility] 2/9 01/03/2021 06/08/2020 05/19/2019 11/26/2018 08/26/2018  Decreased Interest '1 1 1 1 1  ' Down, Depressed, Hopeless '1 1 1 1 2  ' PHQ - 2 Score '2 2 2 2 3  ' Altered sleeping '1 2 1 1 2  ' Tired, decreased energy '2 2 1  1 1  ' Change in appetite '3 3 3 1 3  ' Feeling bad or failure about yourself  '1 1 1 1 1  ' Trouble concentrating 0 0 1 0 1  Moving slowly or fidgety/restless 0 0 0 1 1  Suicidal thoughts 0 0 0 2 1  PHQ-9 Score '9 10 9 9 13  ' Difficult doing work/chores - - Somewhat difficult Somewhat difficult Somewhat difficult  Some recent data might be hidden   Anhedonia: no Weight changes: no Insomnia: no   Hypersomnia: no Fatigue/loss of energy: yes, her generalized pain wears her out Feelings of worthlessness: no Feelings of guilt: no Impaired concentration/indecisiveness: no Suicidal ideations: no  Crying spells: no Recent Stressors/Life Changes: no   Relationship problems: no   Family stress: no     Financial stress: no    Job stress: no    Recent death/loss: no   The patient does not have a history of falls. I did not complete a risk assessment for falls. A plan of care for falls was not documented.   Past Medical History:  Past Medical History:  Diagnosis Date   Anxiety    Constipation    Depression    Fibromyalgia    Insomnia    Migraine    Myalgia and myositis    Ruptured ovarian cyst    Whole body pain     Surgical History:  Past Surgical History:  Procedure Laterality Date   CESAREAN SECTION     e sure     PLACEMENT  OF BREAST IMPLANTS  2009    Medications:  Current Outpatient Medications on File Prior to Visit  Medication Sig   BIOTIN 5000 PO Take 5,000 mg by mouth daily.   Cholecalciferol (VITAMIN D3) 5000 UNITS TABS Take 5,000 Units by mouth daily.   clonazePAM (KLONOPIN) 0.5 MG tablet TAKE 1 TABLET (0.5 MG TOTAL) BY MOUTH DAILY AS NEEDED.   methocarbamol (ROBAXIN) 750 MG tablet Take 375-750 mg by mouth at bedtime as needed.   Multiple Vitamins-Minerals (WOMENS MULTIVITAMIN PLUS PO) Take by mouth daily.   Norethindrone Acetate-Ethinyl Estrad-FE (LOESTRIN 24 FE) 1-20 MG-MCG(24) tablet Take 1 tablet by mouth daily.   QUEtiapine (SEROQUEL XR) 50 MG TB24 24 hr tablet  Take 50 mg by mouth at bedtime.   SUMAtriptan (IMITREX) 50 MG tablet TAKE 1 TABLET (50 MG TOTAL) BY MOUTH EVERY 2 (TWO) HOURS AS NEEDED FOR MIGRAINE. MAY REPEAT IN 2 HOURS IF HEADACHE PERSISTS OR RECURS.   cephALEXin (KEFLEX) 500 MG capsule Take 1 capsule (500 mg total) by mouth 2 (two) times daily. (Patient not taking: Reported on 01/03/2021)   Current Facility-Administered Medications on File Prior to Visit  Medication   betamethasone acetate-betamethasone sodium phosphate (CELESTONE) injection 3 mg    Allergies:  No Known Allergies  Social History:  Social History   Socioeconomic History   Marital status: Married    Spouse name: Not on file   Number of children: Not on file   Years of education: Not on file   Highest education level: Not on file  Occupational History   Not on file  Tobacco Use   Smoking status: Former    Types: Cigarettes    Quit date: 04/21/2014    Years since quitting: 6.7   Smokeless tobacco: Never  Vaping Use   Vaping Use: Every day  Substance and Sexual Activity   Alcohol use: Yes    Alcohol/week: 0.0 standard drinks    Comment: pt states every now and then   Drug use: No   Sexual activity: Yes    Birth control/protection: Other-see comments    Comment: e-sure  Other Topics Concern   Not on file  Social History Narrative   Not on file   Social Determinants of Health   Financial Resource Strain: Not on file  Food Insecurity: Not on file  Transportation Needs: Not on file  Physical Activity: Not on file  Stress: Not on file  Social Connections: Not on file  Intimate Partner Violence: Not on file   Social History   Tobacco Use  Smoking Status Former   Types: Cigarettes   Quit date: 04/21/2014   Years since quitting: 6.7  Smokeless Tobacco Never   Social History   Substance and Sexual Activity  Alcohol Use Yes   Alcohol/week: 0.0 standard drinks   Comment: pt states every now and then    Family History:  Family History   Problem Relation Age of Onset   Migraines Mother    Cancer Father        prostate   Heart disease Father    Lung disease Father    Autism Son    Stroke Paternal Aunt    Diabetes Paternal Uncle     Past medical history, surgical history, medications, allergies, family history and social history reviewed with patient today and changes made to appropriate areas of the chart.   Review of Systems  Constitutional:  Negative for chills and fever.  Respiratory:  Negative for shortness of breath.  Cardiovascular:  Negative for chest pain and palpitations.  Gastrointestinal:  Negative for abdominal pain and constipation.  Musculoskeletal:  Positive for joint pain.  Neurological:  Negative for dizziness, tingling and tremors.  Endo/Heme/Allergies:  Negative for polydipsia.  Psychiatric/Behavioral:  Negative for depression. The patient is not nervous/anxious.   All other ROS negative except what is listed above and in the HPI.      Objective:    BP 105/70   Pulse 89   Temp 99.2 F (37.3 C) (Oral)   Ht 5' 7.5" (1.715 m)   Wt 110 lb 3.2 oz (50 kg)   SpO2 97%   BMI 17.01 kg/m   Wt Readings from Last 3 Encounters:  01/03/21 110 lb 3.2 oz (50 kg)  08/30/20 112 lb 12.8 oz (51.2 kg)  07/13/20 113 lb 9.6 oz (51.5 kg)    Physical Exam Vitals and nursing note reviewed. Exam conducted with a chaperone present.  Constitutional:      Appearance: Normal appearance. She is normal weight.  HENT:     Head: Normocephalic.  Eyes:     General: Lids are normal.  Neck:     Thyroid: No thyroid mass, thyromegaly or thyroid tenderness.     Vascular: No carotid bruit.     Trachea: Trachea normal.  Cardiovascular:     Rate and Rhythm: Normal rate and regular rhythm.     Heart sounds: Normal heart sounds, S1 normal and S2 normal.  Pulmonary:     Effort: Pulmonary effort is normal.     Breath sounds: Normal breath sounds and air entry.  Chest:     Chest wall: No mass, swelling or tenderness.   Breasts:    Right: Normal. No swelling, bleeding, inverted nipple, mass, nipple discharge, skin change or tenderness.     Left: Normal. No swelling, bleeding, inverted nipple, mass, nipple discharge, skin change or tenderness.  Abdominal:     General: Abdomen is flat. Bowel sounds are normal. There is no distension.     Palpations: Abdomen is soft.     Tenderness: There is no abdominal tenderness.  Musculoskeletal:     Right wrist: Normal. No swelling or deformity.     Left wrist: Normal. No swelling or deformity.     Right hand: Normal. No swelling or deformity.     Left hand: Normal. No swelling or deformity.     Right lower leg: Normal. No swelling. No edema.     Left lower leg: Normal. No swelling. No edema.     Right ankle: Normal. No swelling or deformity.     Left ankle: Normal. No swelling or deformity.  Lymphadenopathy:     Cervical: No cervical adenopathy.     Right cervical: No superficial, deep or posterior cervical adenopathy.    Left cervical: No superficial, deep or posterior cervical adenopathy.     Upper Body:     Right upper body: No supraclavicular or axillary adenopathy.     Left upper body: No supraclavicular or axillary adenopathy.  Neurological:     Mental Status: She is alert and oriented to person, place, and time.  Psychiatric:        Behavior: Behavior is cooperative.    Results for orders placed or performed in visit on 07/13/20  Pregnancy, urine  Result Value Ref Range   Preg Test, Ur Negative Negative      Assessment & Plan:   Problem List Items Addressed This Visit       Other  Bipolar I disorder ([place]) - Primary    Chronic ongoing, stable on medication, seen by psych. Started seeing a therapist in October 2022 which is helpful and has been going well. No issues today. Follow up in 6 months      Relevant Orders   CBC with Differential/Platelet   Comprehensive metabolic panel   Lipid Panel w/o Chol/HDL Ratio   Anxiety    Chronic  ongoing, continue medication regimen. Follow up in 6 months      Fibromyalgia    Chronic ongoing, experiences pain on a regular basis, does yoga stretches, alternate with heat and cold therapy which helps. Does monthly massages that also helps. Stated coworker told her about finding out if she has RA. ANA with reflex ordered today. Continue medication regimen. Follows up with rheumatology      Relevant Orders   CBC with Differential/Platelet   Comprehensive metabolic panel   TSH   ANA w/Reflex if Positive   C-reactive protein   Sed Rate (ESR)   Vitamin D deficiency   Relevant Orders   VITAMIN D 25 Hydroxy (Vit-D Deficiency, Fractures)   Irregular menstrual cycle    Stable on birth control with no issues      Other Visit Diagnoses     B12 deficiency       History of low levels, recheck today and initiate supplement as needed.   Relevant Orders   Vitamin B12   Encounter for screening for cardiovascular disorders       Lipid panel today   Relevant Orders   Lipid Panel w/o Chol/HDL Ratio   Encounter for annual physical exam            Follow up plan: Return in about 6 months (around 07/03/2021) for Fibromyalgia and Mood.   LABORATORY TESTING:  - Pap smear: up to date  IMMUNIZATIONS:   - Tdap: Tetanus vaccination status reviewed: last tetanus booster within 10 years. - Influenza: Refused - Pneumovax: Not applicable - Prevnar: Not applicable - COVID: Refused - HPV: Up to date - Shingrix vaccine: Not applicable  SCREENING: -Mammogram: patient will schedule when its time  - Colonoscopy: Not applicable  - Bone Density: Not applicable  -Hearing Test: Not applicable  -Spirometry: Not applicable   PATIENT COUNSELING:   Advised to take 1 mg of folate supplement per day if capable of pregnancy.   Sexuality: Discussed sexually transmitted diseases, partner selection, use of condoms, avoidance of unintended pregnancy  and contraceptive alternatives.   Advised to avoid  cigarette smoking.  I discussed with the patient that most people either abstain from alcohol or drink within safe limits (<=14/week and <=4 drinks/occasion for males, <=7/weeks and <= 3 drinks/occasion for females) and that the risk for alcohol disorders and other health effects rises proportionally with the number of drinks per week and how often a drinker exceeds daily limits.  Discussed cessation/primary prevention of drug use and availability of treatment for abuse.   Diet: Encouraged to adjust caloric intake to maintain  or achieve ideal body weight, to reduce intake of dietary saturated fat and total fat, to limit sodium intake by avoiding high sodium foods and not adding table salt, and to maintain adequate dietary potassium and calcium preferably from fresh fruits, vegetables, and low-fat dairy products.    Stressed the importance of regular exercise  Injury prevention: Discussed safety belts, safety helmets, smoke detector, smoking near bedding or upholstery.   Dental health: Discussed importance of regular tooth brushing, flossing, and dental visits.  NEXT PREVENTATIVE PHYSICAL DUE IN 1 YEAR. Return in about 6 months (around 07/03/2021) for Fibromyalgia and Mood.

## 2021-01-03 NOTE — Assessment & Plan Note (Signed)
Stable on birth control with no issues

## 2021-01-03 NOTE — Patient Instructions (Signed)
Hip Pain The hip is the joint between the upper legs and the lower pelvis. The bones, cartilage, tendons, and muscles of your hip joint support your body and allow you to move around. Hip pain can range from a minor ache to severe pain in one or both of your hips. The pain may be felt on the inside of the hip joint near the groin, or on the outside near the buttocks and upper thigh. You may also have swelling or stiffness in your hip area. Follow these instructions at home: Managing pain, stiffness, and swelling   If directed, put ice on the painful area. To do this: Put ice in a plastic bag. Place a towel between your skin and the bag. Leave the ice on for 20 minutes, 2-3 times a day. If directed, apply heat to the affected area as often as told by your health care provider. Use the heat source that your health care provider recommends, such as a moist heat pack or a heating pad. Place a towel between your skin and the heat source. Leave the heat on for 20-30 minutes. Remove the heat if your skin turns bright red. This is especially important if you are unable to feel pain, heat, or cold. You may have a greater risk of getting burned. Activity Do exercises as told by your health care provider. Avoid activities that cause pain. General instructions  Take over-the-counter and prescription medicines only as told by your health care provider. Keep a journal of your symptoms. Write down: How often you have hip pain. The location of your pain. What the pain feels like. What makes the pain worse. Sleep with a pillow between your legs on your most comfortable side. Keep all follow-up visits as told by your health care provider. This is important. Contact a health care provider if: You cannot put weight on your leg. Your pain or swelling continues or gets worse after one week. It gets harder to walk. You have a fever. Get help right away if: You fall. You have a sudden increase in pain and  swelling in your hip. Your hip is red or swollen or very tender to touch. Summary Hip pain can range from a minor ache to severe pain in one or both of your hips. The pain may be felt on the inside of the hip joint near the groin, or on the outside near the buttocks and upper thigh. Avoid activities that cause pain. Write down how often you have hip pain, the location of the pain, what makes it worse, and what it feels like. This information is not intended to replace advice given to you by your health care provider. Make sure you discuss any questions you have with your health care provider. Document Revised: 06/10/2018 Document Reviewed: 06/10/2018 Elsevier Patient Education  2022 [employer]  

## 2021-01-04 LAB — CBC WITH DIFFERENTIAL/PLATELET
Basophils Absolute: 0.1 10*3/uL (ref 0.0–0.2)
Basos: 1 %
EOS (ABSOLUTE): 0.1 10*3/uL (ref 0.0–0.4)
Eos: 1 %
Hematocrit: 39.6 % (ref 34.0–46.6)
Hemoglobin: 13.2 g/dL (ref 11.1–15.9)
Immature Grans (Abs): 0 10*3/uL (ref 0.0–0.1)
Immature Granulocytes: 0 %
Lymphocytes Absolute: 1.1 10*3/uL (ref 0.7–3.1)
Lymphs: 26 %
MCH: 30.2 pg (ref 26.6–33.0)
MCHC: 33.3 g/dL (ref 31.5–35.7)
MCV: 91 fL (ref 79–97)
Monocytes Absolute: 0.3 10*3/uL (ref 0.1–0.9)
Monocytes: 7 %
Neutrophils Absolute: 2.7 10*3/uL (ref 1.4–7.0)
Neutrophils: 65 %
Platelets: 271 10*3/uL (ref 150–450)
RBC: 4.37 x10E6/uL (ref 3.77–5.28)
RDW: 12.2 % (ref 11.7–15.4)
WBC: 4.2 10*3/uL (ref 3.4–10.8)

## 2021-01-04 LAB — COMPREHENSIVE METABOLIC PANEL
ALT: 13 IU/L (ref 0–32)
AST: 12 IU/L (ref 0–40)
Albumin/Globulin Ratio: 2.5 — ABNORMAL HIGH (ref 1.2–2.2)
Albumin: 4.5 g/dL (ref 3.8–4.8)
Alkaline Phosphatase: 50 IU/L (ref 44–121)
BUN/Creatinine Ratio: 13 (ref 9–23)
BUN: 12 mg/dL (ref 6–24)
Bilirubin Total: 0.3 mg/dL (ref 0.0–1.2)
CO2: 24 mmol/L (ref 20–29)
Calcium: 8.9 mg/dL (ref 8.7–10.2)
Chloride: 103 mmol/L (ref 96–106)
Creatinine, Ser: 0.9 mg/dL (ref 0.57–1.00)
Globulin, Total: 1.8 g/dL (ref 1.5–4.5)
Glucose: 73 mg/dL (ref 70–99)
Potassium: 3.9 mmol/L (ref 3.5–5.2)
Sodium: 140 mmol/L (ref 134–144)
Total Protein: 6.3 g/dL (ref 6.0–8.5)
eGFR: 81 mL/min/{1.73_m2} (ref >59)

## 2021-01-04 LAB — LIPID PANEL W/O CHOL/HDL RATIO
Cholesterol, Total: 152 mg/dL (ref 100–199)
HDL: 62 mg/dL (ref >39)
LDL Chol Calc (NIH): 74 mg/dL (ref 0–99)
Triglycerides: 88 mg/dL (ref 0–149)
VLDL Cholesterol Cal: 16 mg/dL (ref 5–40)

## 2021-01-04 LAB — SEDIMENTATION RATE: Sed Rate: 2 mm/hr (ref 0–32)

## 2021-01-04 LAB — VITAMIN B12: Vitamin B-12: 787 pg/mL (ref 232–1245)

## 2021-01-04 LAB — VITAMIN D 25 HYDROXY (VIT D DEFICIENCY, FRACTURES): Vit D, 25-Hydroxy: 74.6 ng/mL (ref 30.0–100.0)

## 2021-01-04 LAB — ANA W/REFLEX IF POSITIVE: Anti Nuclear Antibody (ANA): NEGATIVE

## 2021-01-04 LAB — TSH: TSH: 2.11 u[IU]/mL (ref 0.450–4.500)

## 2021-01-04 LAB — C-REACTIVE PROTEIN: CRP: 2 mg/L (ref 0–10)

## 2021-01-04 NOTE — Progress Notes (Signed)
Contacted via [place] afternoon [place], your labs have returned: - CBC shows no infection or anemia - CMP shows normal kidney function, creatinine and eGFR, + normal liver function - AST and ALT. - Cholesterol levels are great - Thyroid and Vitamin D are normal - B12 level is normal - ANA and CRP + ESR are all normal -- showing no autoimmune findings on these checks.  Any questions? Keep being stellar!!  Thank you for allowing me to participate in your care.  I appreciate you. Kindest regards, Genesis Novosad

## 2021-01-12 DIAGNOSIS — M9905 Segmental and somatic dysfunction of pelvic region: Secondary | ICD-10-CM | POA: Diagnosis not present

## 2021-01-12 DIAGNOSIS — M41117 Juvenile idiopathic scoliosis, lumbosacral region: Secondary | ICD-10-CM | POA: Diagnosis not present

## 2021-01-12 DIAGNOSIS — M9901 Segmental and somatic dysfunction of cervical region: Secondary | ICD-10-CM | POA: Diagnosis not present

## 2021-01-12 DIAGNOSIS — M5431 Sciatica, right side: Secondary | ICD-10-CM | POA: Diagnosis not present

## 2021-01-12 DIAGNOSIS — M9903 Segmental and somatic dysfunction of lumbar region: Secondary | ICD-10-CM | POA: Diagnosis not present

## 2021-01-12 DIAGNOSIS — M5412 Radiculopathy, cervical region: Secondary | ICD-10-CM | POA: Diagnosis not present

## 2021-01-13 DIAGNOSIS — F3132 Bipolar disorder, current episode depressed, moderate: Secondary | ICD-10-CM | POA: Diagnosis not present

## 2021-01-18 DIAGNOSIS — M41117 Juvenile idiopathic scoliosis, lumbosacral region: Secondary | ICD-10-CM | POA: Diagnosis not present

## 2021-01-18 DIAGNOSIS — M9903 Segmental and somatic dysfunction of lumbar region: Secondary | ICD-10-CM | POA: Diagnosis not present

## 2021-01-18 DIAGNOSIS — M9901 Segmental and somatic dysfunction of cervical region: Secondary | ICD-10-CM | POA: Diagnosis not present

## 2021-01-18 DIAGNOSIS — M5431 Sciatica, right side: Secondary | ICD-10-CM | POA: Diagnosis not present

## 2021-01-18 DIAGNOSIS — M5412 Radiculopathy, cervical region: Secondary | ICD-10-CM | POA: Diagnosis not present

## 2021-01-18 DIAGNOSIS — M9905 Segmental and somatic dysfunction of pelvic region: Secondary | ICD-10-CM | POA: Diagnosis not present

## 2021-01-20 DIAGNOSIS — F3170 Bipolar disorder, currently in remission, most recent episode unspecified: Secondary | ICD-10-CM | POA: Diagnosis not present

## 2021-01-25 DIAGNOSIS — F3170 Bipolar disorder, currently in remission, most recent episode unspecified: Secondary | ICD-10-CM | POA: Diagnosis not present

## 2021-02-08 DIAGNOSIS — R1030 Lower abdominal pain, unspecified: Secondary | ICD-10-CM | POA: Diagnosis not present

## 2021-02-08 DIAGNOSIS — M5489 Other dorsalgia: Secondary | ICD-10-CM | POA: Diagnosis not present

## 2021-02-08 DIAGNOSIS — M5030 Other cervical disc degeneration, unspecified cervical region: Secondary | ICD-10-CM | POA: Diagnosis not present

## 2021-02-08 DIAGNOSIS — M542 Cervicalgia: Secondary | ICD-10-CM | POA: Diagnosis not present

## 2021-02-09 DIAGNOSIS — M41117 Juvenile idiopathic scoliosis, lumbosacral region: Secondary | ICD-10-CM | POA: Diagnosis not present

## 2021-02-09 DIAGNOSIS — M5412 Radiculopathy, cervical region: Secondary | ICD-10-CM | POA: Diagnosis not present

## 2021-02-09 DIAGNOSIS — M9903 Segmental and somatic dysfunction of lumbar region: Secondary | ICD-10-CM | POA: Diagnosis not present

## 2021-02-09 DIAGNOSIS — M9901 Segmental and somatic dysfunction of cervical region: Secondary | ICD-10-CM | POA: Diagnosis not present

## 2021-02-16 ENCOUNTER — Encounter: Payer: Self-pay | Admitting: Nurse Practitioner

## 2021-02-16 DIAGNOSIS — M5431 Sciatica, right side: Secondary | ICD-10-CM | POA: Diagnosis not present

## 2021-02-16 DIAGNOSIS — M5412 Radiculopathy, cervical region: Secondary | ICD-10-CM | POA: Diagnosis not present

## 2021-02-16 DIAGNOSIS — M9903 Segmental and somatic dysfunction of lumbar region: Secondary | ICD-10-CM | POA: Diagnosis not present

## 2021-02-16 DIAGNOSIS — K5909 Other constipation: Secondary | ICD-10-CM

## 2021-02-16 DIAGNOSIS — K599 Functional intestinal disorder, unspecified: Secondary | ICD-10-CM

## 2021-02-16 DIAGNOSIS — M9905 Segmental and somatic dysfunction of pelvic region: Secondary | ICD-10-CM | POA: Diagnosis not present

## 2021-02-16 DIAGNOSIS — M41117 Juvenile idiopathic scoliosis, lumbosacral region: Secondary | ICD-10-CM | POA: Diagnosis not present

## 2021-02-16 DIAGNOSIS — M9901 Segmental and somatic dysfunction of cervical region: Secondary | ICD-10-CM | POA: Diagnosis not present

## 2021-02-18 DIAGNOSIS — F3170 Bipolar disorder, currently in remission, most recent episode unspecified: Secondary | ICD-10-CM | POA: Diagnosis not present

## 2021-02-21 ENCOUNTER — Telehealth: Payer: Medicaid Other | Admitting: Nurse Practitioner

## 2021-02-21 DIAGNOSIS — N3000 Acute cystitis without hematuria: Secondary | ICD-10-CM

## 2021-02-21 MED ORDER — NITROFURANTOIN MONOHYD MACRO 100 MG PO CAPS: 100.0000 mg | ORAL_CAPSULE | Freq: Two times a day (BID) | ORAL | 0 refills | Status: AC

## 2021-02-21 NOTE — Progress Notes (Signed)
E-Visit for Urinary Problems  We are sorry that you are not feeling well.  Here is how we plan to help!  Based on what you shared with me it looks like you most likely have a simple urinary tract infection.  A UTI (Urinary Tract Infection) is a bacterial infection of the bladder.  Most cases of urinary tract infections are simple to treat but a key part of your care is to encourage you to drink plenty of fluids and watch your symptoms carefully.  I have prescribed MacroBid 100 mg twice a day for 5 days.  Your symptoms should gradually improve. Call us if the burning in your urine worsens, you develop worsening fever, back pain or pelvic pain or if your symptoms do not resolve after completing the antibiotic.  Urinary tract infections can be prevented by drinking plenty of water to keep your body hydrated.  Also be sure when you wipe, wipe from front to back and don't hold it in!  If possible, empty your bladder every 4 hours.  HOME CARE Drink plenty of fluids Compete the full course of the antibiotics even if the symptoms resolve Remember, when you need to go.go. Holding in your urine can increase the likelihood of getting a UTI! GET HELP RIGHT AWAY IF: You cannot urinate You get a high fever Worsening back pain occurs You see blood in your urine You feel sick to your stomach or throw up You feel like you are going to pass out  MAKE SURE YOU  Understand these instructions. Will watch your condition. Will get help right away if you are not doing well or get worse.   Thank you for choosing an e-visit.  Your e-visit answers were reviewed by a board certified advanced clinical practitioner to complete your personal care plan. Depending upon the condition, your plan could have included both over the counter or prescription medications.  Please review your pharmacy choice. Make sure the pharmacy is open so you can pick up prescription now. If there is a problem, you may contact your  provider through MyChart messaging and have the prescription routed to another pharmacy.  Your safety is important to us. If you have drug allergies check your prescription carefully.   For the next 24 hours you can use MyChart to ask questions about today's visit, request a non-urgent call back, or ask for a work or school excuse. You will get an email in the next two days asking about your experience. I hope that your e-visit has been valuable and will speed your recovery.   I spent approximately 5 minutes reviewing the patient's history, current symptoms and coordinating their plan of care today.   Meds ordered this encounter  Medications   nitrofurantoin, macrocrystal-monohydrate, (MACROBID) 100 MG capsule    Sig: Take 1 capsule (100 mg total) by mouth 2 (two) times daily for 5 days.    Dispense:  10 capsule    Refill:  0    

## 2021-03-10 DIAGNOSIS — R519 Headache, unspecified: Secondary | ICD-10-CM | POA: Diagnosis not present

## 2021-03-10 DIAGNOSIS — M542 Cervicalgia: Secondary | ICD-10-CM | POA: Diagnosis not present

## 2021-03-10 DIAGNOSIS — F3170 Bipolar disorder, currently in remission, most recent episode unspecified: Secondary | ICD-10-CM | POA: Diagnosis not present

## 2021-03-22 DIAGNOSIS — M542 Cervicalgia: Secondary | ICD-10-CM | POA: Diagnosis not present

## 2021-03-22 DIAGNOSIS — R519 Headache, unspecified: Secondary | ICD-10-CM | POA: Diagnosis not present

## 2021-03-29 DIAGNOSIS — M542 Cervicalgia: Secondary | ICD-10-CM | POA: Diagnosis not present

## 2021-03-29 DIAGNOSIS — R519 Headache, unspecified: Secondary | ICD-10-CM | POA: Diagnosis not present

## 2021-04-05 DIAGNOSIS — M9905 Segmental and somatic dysfunction of pelvic region: Secondary | ICD-10-CM | POA: Diagnosis not present

## 2021-04-05 DIAGNOSIS — M9903 Segmental and somatic dysfunction of lumbar region: Secondary | ICD-10-CM | POA: Diagnosis not present

## 2021-04-05 DIAGNOSIS — M5431 Sciatica, right side: Secondary | ICD-10-CM | POA: Diagnosis not present

## 2021-04-05 DIAGNOSIS — K599 Functional intestinal disorder, unspecified: Secondary | ICD-10-CM | POA: Diagnosis not present

## 2021-04-05 DIAGNOSIS — M5136 Other intervertebral disc degeneration, lumbar region: Secondary | ICD-10-CM | POA: Diagnosis not present

## 2021-04-05 DIAGNOSIS — K5909 Other constipation: Secondary | ICD-10-CM | POA: Diagnosis not present

## 2021-04-05 DIAGNOSIS — M5412 Radiculopathy, cervical region: Secondary | ICD-10-CM | POA: Diagnosis not present

## 2021-04-05 DIAGNOSIS — M5416 Radiculopathy, lumbar region: Secondary | ICD-10-CM | POA: Diagnosis not present

## 2021-04-05 DIAGNOSIS — M9901 Segmental and somatic dysfunction of cervical region: Secondary | ICD-10-CM | POA: Diagnosis not present

## 2021-04-05 DIAGNOSIS — M41117 Juvenile idiopathic scoliosis, lumbosacral region: Secondary | ICD-10-CM | POA: Diagnosis not present

## 2021-04-14 ENCOUNTER — Other Ambulatory Visit: Payer: Self-pay | Admitting: Family Medicine

## 2021-04-14 DIAGNOSIS — M5416 Radiculopathy, lumbar region: Secondary | ICD-10-CM

## 2021-04-20 ENCOUNTER — Other Ambulatory Visit: Payer: Self-pay

## 2021-04-20 ENCOUNTER — Ambulatory Visit
Admission: RE | Admit: 2021-04-20 | Discharge: 2021-04-20 | Disposition: A | Discharge status: Home / Self Care | Payer: Medicaid Other | Source: Ambulatory Visit | Attending: Family Medicine | Admitting: Family Medicine

## 2021-04-20 DIAGNOSIS — M545 Low back pain, unspecified: Secondary | ICD-10-CM | POA: Diagnosis not present

## 2021-04-20 DIAGNOSIS — M47816 Spondylosis without myelopathy or radiculopathy, lumbar region: Secondary | ICD-10-CM | POA: Diagnosis not present

## 2021-04-20 DIAGNOSIS — M5416 Radiculopathy, lumbar region: Secondary | ICD-10-CM

## 2021-04-29 DIAGNOSIS — F3132 Bipolar disorder, current episode depressed, moderate: Secondary | ICD-10-CM | POA: Diagnosis not present

## 2021-05-03 DIAGNOSIS — M41117 Juvenile idiopathic scoliosis, lumbosacral region: Secondary | ICD-10-CM | POA: Diagnosis not present

## 2021-05-03 DIAGNOSIS — M5431 Sciatica, right side: Secondary | ICD-10-CM | POA: Diagnosis not present

## 2021-05-03 DIAGNOSIS — M9905 Segmental and somatic dysfunction of pelvic region: Secondary | ICD-10-CM | POA: Diagnosis not present

## 2021-05-03 DIAGNOSIS — M9903 Segmental and somatic dysfunction of lumbar region: Secondary | ICD-10-CM | POA: Diagnosis not present

## 2021-05-03 DIAGNOSIS — M5412 Radiculopathy, cervical region: Secondary | ICD-10-CM | POA: Diagnosis not present

## 2021-05-03 DIAGNOSIS — M9901 Segmental and somatic dysfunction of cervical region: Secondary | ICD-10-CM | POA: Diagnosis not present

## 2021-05-09 DIAGNOSIS — M5416 Radiculopathy, lumbar region: Secondary | ICD-10-CM | POA: Diagnosis not present

## 2021-05-09 DIAGNOSIS — M48062 Spinal stenosis, lumbar region with neurogenic claudication: Secondary | ICD-10-CM | POA: Diagnosis not present

## 2021-06-07 DIAGNOSIS — M5431 Sciatica, right side: Secondary | ICD-10-CM | POA: Diagnosis not present

## 2021-06-07 DIAGNOSIS — M41117 Juvenile idiopathic scoliosis, lumbosacral region: Secondary | ICD-10-CM | POA: Diagnosis not present

## 2021-06-07 DIAGNOSIS — M9905 Segmental and somatic dysfunction of pelvic region: Secondary | ICD-10-CM | POA: Diagnosis not present

## 2021-06-07 DIAGNOSIS — M9903 Segmental and somatic dysfunction of lumbar region: Secondary | ICD-10-CM | POA: Diagnosis not present

## 2021-06-07 DIAGNOSIS — M9901 Segmental and somatic dysfunction of cervical region: Secondary | ICD-10-CM | POA: Diagnosis not present

## 2021-06-07 DIAGNOSIS — M5412 Radiculopathy, cervical region: Secondary | ICD-10-CM | POA: Diagnosis not present

## 2021-06-10 DIAGNOSIS — F3132 Bipolar disorder, current episode depressed, moderate: Secondary | ICD-10-CM | POA: Diagnosis not present

## 2021-07-05 ENCOUNTER — Encounter: Payer: Self-pay | Admitting: Nurse Practitioner

## 2021-07-05 ENCOUNTER — Ambulatory Visit: Payer: Medicaid Other | Admitting: Nurse Practitioner

## 2021-07-05 VITALS — BP 91/65 | HR 80 | Temp 98.6°F | Ht 67.5 in | Wt 110.0 lb

## 2021-07-05 DIAGNOSIS — M797 Fibromyalgia: Secondary | ICD-10-CM | POA: Diagnosis not present

## 2021-07-05 DIAGNOSIS — F419 Anxiety disorder, unspecified: Secondary | ICD-10-CM

## 2021-07-05 DIAGNOSIS — F319 Bipolar disorder, unspecified: Secondary | ICD-10-CM | POA: Diagnosis not present

## 2021-07-05 DIAGNOSIS — G43109 Migraine with aura, not intractable, without status migrainosus: Secondary | ICD-10-CM

## 2021-07-05 DIAGNOSIS — M9905 Segmental and somatic dysfunction of pelvic region: Secondary | ICD-10-CM | POA: Diagnosis not present

## 2021-07-05 DIAGNOSIS — M9903 Segmental and somatic dysfunction of lumbar region: Secondary | ICD-10-CM | POA: Diagnosis not present

## 2021-07-05 DIAGNOSIS — M5412 Radiculopathy, cervical region: Secondary | ICD-10-CM | POA: Diagnosis not present

## 2021-07-05 DIAGNOSIS — M41117 Juvenile idiopathic scoliosis, lumbosacral region: Secondary | ICD-10-CM | POA: Diagnosis not present

## 2021-07-05 DIAGNOSIS — M9901 Segmental and somatic dysfunction of cervical region: Secondary | ICD-10-CM | POA: Diagnosis not present

## 2021-07-05 DIAGNOSIS — M5431 Sciatica, right side: Secondary | ICD-10-CM | POA: Diagnosis not present

## 2021-07-05 MED ORDER — SUMATRIPTAN SUCCINATE 50 MG PO TABS
50.0000 mg | ORAL_TABLET | ORAL | 6 refills | Status: DC | PRN
Start: 2021-07-05 — End: 2021-07-05

## 2021-07-05 MED ORDER — SUMATRIPTAN SUCCINATE 50 MG PO TABS: 50.0000 mg | ORAL_TABLET | ORAL | 6 refills | Status: DC | PRN

## 2021-07-05 NOTE — Assessment & Plan Note (Signed)
Chronic, ongoing.  Denies SI/HI.  Continue current medication regimen, including Lamictal, plus continue collaboration with psychiatry.  Return in 6 months for follow-up and physical.

## 2021-07-05 NOTE — Assessment & Plan Note (Signed)
Chronic, ongoing.  Continue current medication regimen.  Continue collaboration visits with [facility].  Currently not taking Tramadol.

## 2021-07-05 NOTE — Patient Instructions (Signed)

## 2021-07-05 NOTE — Assessment & Plan Note (Signed)
Chronic, ongoing with increase due to recent stressors.  Imitrex refills sent in.

## 2021-07-05 NOTE — Progress Notes (Signed)
BP 91/65   Pulse 80   Temp 98.6 F (37 C) (Oral)   Ht 5' 7.5" (1.715 m)   Wt 110 lb (49.9 kg)   SpO2 97%   BMI 16.97 kg/m    Subjective:    Patient ID: Wanda Blanchard, female    DOB: August 11, 1975, 46 y.o.   MRN: [id number]  HPI: Wanda Blanchard is a 46 y.o. female  Chief Complaint  Patient presents with   Fibromyalgia    Patient is here for a six month follow up.    Mood    Patient states her father's wife passed away recently and she has been responsible for her dad and his wife's affairs due to her passing away. Patient states she is still working and taking care of her 33 year old child. Patient states she does not have any help. Patient states since April she has had 3 deaths in her family.    Medication Dose Change    Patient states her psychiatrist prescribed her Lamictal 25 and patient states she is currently taking 3 tablets in the morning. Patient states she is scheduled to follow up on Friday to follow up since change with the instructions.    Medication Refill    Patient is requesting a refill on her Imitrex medication at today's visit.    FIBROMYALGIA Is followed by Dr. Sharlet Salina at [facility], last injection 05/09/21 - she reports this has worked well for her.   Pain status: stable Satisfied with current treatment?: yes Medication side effects: no Medication compliance: good compliance Duration: long term Location: multiple joints Quality: aching Current pain level: mild Aggravating factors: none Alleviating factors: ice, heat, NSAIDs, and muscle relaxer Non-narcotic analgesic meds: yes Narcotic contract:yes Treatments attempted: ice, heat, and ibuprofen    MIGRAINES Continues on Imitrex as needed.  Using twice a week at this time due to stressors. Duration: chronic Onset: gradual Severity: 10/10 Quality: dull, aching, and throbbing Frequency: intermittent Location: left or right sided Headache duration: 24 hours Radiation: no Time of day headache occurs:  varies Alleviating factors: Imitrex Aggravating factors: stress Headache status at time of visit: asymptomatic Treatments attempted: Treatments attempted: triptans   Aura: yes Nausea:  yes Vomiting: yes Photophobia:  yes Phonophobia:  yes Effect on social functioning:  yes Numbers of missed days of school/work each month: none Confusion:  no Gait disturbance/ataxia:  no Behavioral changes:  no Fevers:  no    BIPOLAR DISORDER Followed by psychiatry and receives all medications through them -- Lamictal, Seroquel, Lamictal, and Klonopin.  She has follow-up upcoming with [facility].   Duration:controlled Anxious mood: no  Excessive worrying: no Irritability: no  Sweating: no Nausea: no Palpitations:yes Hyperventilation: no Panic attacks: yes Agoraphobia: no  Obscessions/compulsions: no Depressed mood: no    01/03/2021    2:06 PM 06/08/2020    2:10 PM 05/19/2019    1:50 PM 11/26/2018   10:58 AM 08/26/2018   10:38 AM  Depression screen PHQ 2/9  Decreased Interest '1 1 1 1 1  ' Down, Depressed, Hopeless '1 1 1 1 2  ' PHQ - 2 Score '2 2 2 2 3  ' Altered sleeping '1 2 1 1 2  ' Tired, decreased energy '2 2 1 1 1  ' Change in appetite '3 3 3 1 3  ' Feeling bad or failure about yourself  '1 1 1 1 1  ' Trouble concentrating 0 0 1 0 1  Moving slowly or fidgety/restless 0 0 0 1 1  Suicidal thoughts 0 0  0 2 1  PHQ-9 Score '9 10 9 9 13  ' Difficult doing work/chores   Somewhat difficult Somewhat difficult Somewhat difficult       01/03/2021    2:07 PM 06/08/2020    2:12 PM 05/19/2019    1:49 PM 11/26/2018   10:59 AM  GAD 7 : Generalized Anxiety Score  Nervous, Anxious, on Edge '1 1 1 1  ' Control/stop worrying '1 1 2 2  ' Worry too much - different things '1 1 2 1  ' Trouble relaxing 1 0 1 1  Restless 0 1 0 1  Easily annoyed or irritable 1 0 2 2  Afraid - awful might happen 2 0 1 3  Total GAD 7 Score '7 4 9 11  ' Anxiety Difficulty   Somewhat difficult Not difficult at all      Relevant past  medical, surgical, family and social history reviewed and updated as indicated. Interim medical history since our last visit reviewed. Allergies and medications reviewed and updated.  Review of Systems  Constitutional:  Negative for activity change, appetite change, diaphoresis, fatigue and fever.  Respiratory:  Negative for cough, chest tightness and shortness of breath.   Cardiovascular:  Negative for chest pain, palpitations and leg swelling.  Gastrointestinal: Negative.   Neurological:  Positive for headaches. Negative for dizziness, syncope, weakness, light-headedness and numbness.  Psychiatric/Behavioral:  Positive for decreased concentration and sleep disturbance. Negative for self-injury and suicidal ideas. The patient is nervous/anxious.    Per HPI unless specifically indicated above     Objective:    BP 91/65   Pulse 80   Temp 98.6 F (37 C) (Oral)   Ht 5' 7.5" (1.715 m)   Wt 110 lb (49.9 kg)   SpO2 97%   BMI 16.97 kg/m   Wt Readings from Last 3 Encounters:  07/05/21 110 lb (49.9 kg)  01/03/21 110 lb 3.2 oz (50 kg)  08/30/20 112 lb 12.8 oz (51.2 kg)    Physical Exam Vitals and nursing note reviewed.  Constitutional:      General: She is awake. She is not in acute distress.    Appearance: She is well-developed. She is not ill-appearing.  HENT:     Head: Normocephalic.     Right Ear: Hearing normal.     Left Ear: Hearing normal.     Nose: Nose normal.     Mouth/Throat:     Mouth: Mucous membranes are moist.  Eyes:     General: Lids are normal.        Right eye: No discharge.        Left eye: No discharge.     Conjunctiva/sclera: Conjunctivae normal.     Pupils: Pupils are equal, round, and reactive to light.  Neck:     Thyroid: No thyromegaly.     Vascular: No carotid bruit or JVD.  Cardiovascular:     Rate and Rhythm: Normal rate and regular rhythm.     Heart sounds: Normal heart sounds. No murmur heard.   No gallop.  Pulmonary:     Effort: Pulmonary  effort is normal. No accessory muscle usage or respiratory distress.     Breath sounds: Normal breath sounds.  Abdominal:     General: Bowel sounds are normal.     Palpations: Abdomen is soft. There is no hepatomegaly or splenomegaly.  Musculoskeletal:     Cervical back: Normal range of motion and neck supple.     Right lower leg: No edema.     Left  lower leg: No edema.  Lymphadenopathy:     Cervical: No cervical adenopathy.  Skin:    General: Skin is warm and dry.  Neurological:     Mental Status: She is alert and oriented to person, place, and time.  Psychiatric:        Attention and Perception: Attention normal.        Mood and Affect: Mood normal.        Behavior: Behavior normal. Behavior is cooperative.        Thought Content: Thought content normal.        Judgment: Judgment normal.   Results for orders placed or performed in visit on 01/03/21  CBC with Differential/Platelet  Result Value Ref Range   WBC 4.2 3.4 - 10.8 x10E3/uL   RBC 4.37 3.77 - 5.28 x10E6/uL   Hemoglobin 13.2 11.1 - 15.9 g/dL   Hematocrit 39.6 34.0 - 46.6 %   MCV 91 79 - 97 fL   MCH 30.2 26.6 - 33.0 pg   MCHC 33.3 31.5 - 35.7 g/dL   RDW 12.2 11.7 - 15.4 %   Platelets 271 150 - 450 x10E3/uL   Neutrophils 65 Not Estab. %   Lymphs 26 Not Estab. %   Monocytes 7 Not Estab. %   Eos 1 Not Estab. %   Basos 1 Not Estab. %   Neutrophils Absolute 2.7 1.4 - 7.0 x10E3/uL   Lymphocytes Absolute 1.1 0.7 - 3.1 x10E3/uL   Monocytes Absolute 0.3 0.1 - 0.9 x10E3/uL   EOS (ABSOLUTE) 0.1 0.0 - 0.4 x10E3/uL   Basophils Absolute 0.1 0.0 - 0.2 x10E3/uL   Immature Granulocytes 0 Not Estab. %   Immature Grans (Abs) 0.0 0.0 - 0.1 x10E3/uL  Comprehensive metabolic panel  Result Value Ref Range   Glucose 73 70 - 99 mg/dL   BUN 12 6 - 24 mg/dL   Creatinine, Ser 0.90 0.57 - 1.00 mg/dL   eGFR 81 >59 mL/min/1.73   BUN/Creatinine Ratio 13 9 - 23   Sodium 140 134 - 144 mmol/L   Potassium 3.9 3.5 - 5.2 mmol/L   Chloride  103 96 - 106 mmol/L   CO2 24 20 - 29 mmol/L   Calcium 8.9 8.7 - 10.2 mg/dL   Total Protein 6.3 6.0 - 8.5 g/dL   Albumin 4.5 3.8 - 4.8 g/dL   Globulin, Total 1.8 1.5 - 4.5 g/dL   Albumin/Globulin Ratio 2.5 (H) 1.2 - 2.2   Bilirubin Total 0.3 0.0 - 1.2 mg/dL   Alkaline Phosphatase 50 44 - 121 IU/L   AST 12 0 - 40 IU/L   ALT 13 0 - 32 IU/L  Lipid Panel w/o Chol/HDL Ratio  Result Value Ref Range   Cholesterol, Total 152 100 - 199 mg/dL   Triglycerides 88 0 - 149 mg/dL   HDL 62 >39 mg/dL   VLDL Cholesterol Cal 16 5 - 40 mg/dL   LDL Chol Calc (NIH) 74 0 - 99 mg/dL  TSH  Result Value Ref Range   TSH 2.110 0.450 - 4.500 uIU/mL  VITAMIN D 25 Hydroxy (Vit-D Deficiency, Fractures)  Result Value Ref Range   Vit D, 25-Hydroxy 74.6 30.0 - 100.0 ng/mL  Vitamin B12  Result Value Ref Range   Vitamin B-12 787 232 - 1,245 pg/mL  ANA w/Reflex if Positive  Result Value Ref Range   Anti Nuclear Antibody (ANA) Negative Negative  C-reactive protein  Result Value Ref Range   CRP 2 0 - 10 mg/L  Sed Rate (  ESR)  Result Value Ref Range   Sed Rate 2 0 - 32 mm/hr      Assessment & Plan:   Problem List Items Addressed This Visit       Cardiovascular and Mediastinum   Migraine    Chronic, ongoing with increase due to recent stressors.  Imitrex refills sent in.       Relevant Medications   lamoTRIgine (LAMICTAL) 25 MG tablet   SUMAtriptan (IMITREX) 50 MG tablet     Other   Anxiety    Refer to depression plan of care.       Bipolar I disorder ([place]) - Primary    Chronic, ongoing.  Denies SI/HI.  Continue current medication regimen, including Lamictal, plus continue collaboration with psychiatry.  Return in 6 months for follow-up and physical.       Fibromyalgia    Chronic, ongoing.  Continue current medication regimen.  Continue collaboration visits with [facility].  Currently not taking Tramadol.       Relevant Medications   lamoTRIgine (LAMICTAL) 25 MG tablet     Follow up  plan: Return in about 6 months (around 01/05/2022) for Annual physical after 01/03/22.

## 2021-07-05 NOTE — Assessment & Plan Note (Signed)
Refer to depression plan of care. 

## 2021-07-08 DIAGNOSIS — F3132 Bipolar disorder, current episode depressed, moderate: Secondary | ICD-10-CM | POA: Diagnosis not present

## 2021-08-05 DIAGNOSIS — M5412 Radiculopathy, cervical region: Secondary | ICD-10-CM | POA: Diagnosis not present

## 2021-08-05 DIAGNOSIS — M5431 Sciatica, right side: Secondary | ICD-10-CM | POA: Diagnosis not present

## 2021-08-05 DIAGNOSIS — M9903 Segmental and somatic dysfunction of lumbar region: Secondary | ICD-10-CM | POA: Diagnosis not present

## 2021-08-05 DIAGNOSIS — M9905 Segmental and somatic dysfunction of pelvic region: Secondary | ICD-10-CM | POA: Diagnosis not present

## 2021-08-05 DIAGNOSIS — M41117 Juvenile idiopathic scoliosis, lumbosacral region: Secondary | ICD-10-CM | POA: Diagnosis not present

## 2021-08-05 DIAGNOSIS — M9901 Segmental and somatic dysfunction of cervical region: Secondary | ICD-10-CM | POA: Diagnosis not present

## 2021-08-10 ENCOUNTER — Ambulatory Visit: Payer: Medicaid Other | Admitting: Nurse Practitioner

## 2021-08-30 DIAGNOSIS — M9905 Segmental and somatic dysfunction of pelvic region: Secondary | ICD-10-CM | POA: Diagnosis not present

## 2021-08-30 DIAGNOSIS — M41117 Juvenile idiopathic scoliosis, lumbosacral region: Secondary | ICD-10-CM | POA: Diagnosis not present

## 2021-08-30 DIAGNOSIS — M9901 Segmental and somatic dysfunction of cervical region: Secondary | ICD-10-CM | POA: Diagnosis not present

## 2021-08-30 DIAGNOSIS — M5412 Radiculopathy, cervical region: Secondary | ICD-10-CM | POA: Diagnosis not present

## 2021-08-30 DIAGNOSIS — M5431 Sciatica, right side: Secondary | ICD-10-CM | POA: Diagnosis not present

## 2021-08-30 DIAGNOSIS — M9903 Segmental and somatic dysfunction of lumbar region: Secondary | ICD-10-CM | POA: Diagnosis not present

## 2021-09-07 ENCOUNTER — Encounter: Payer: Self-pay | Admitting: Nurse Practitioner

## 2021-09-17 NOTE — Patient Instructions (Signed)
Leg Cramps Leg cramps occur when one or more muscles tighten and a person has no control over it (involuntary muscle contraction). Muscle cramps are most common in the calf muscles of the leg. They can occur during exercise or at rest. Leg cramps are painful, and they may last for a few seconds to a few minutes. Cramps may return several times before they finally stop. Usually, leg cramps are not caused by a serious medical problem. In many cases, the cause is not known. Some common causes include: Excessive physical effort (overexertion), such as during intense exercise. Doing the same motion over and over. Staying in a certain position for a long period of time. Improper preparation, form, or technique while doing a sport or an activity. Dehydration. Injury. Side effects of certain medicines. Abnormally low levels of minerals in your blood (electrolytes), especially potassium and calcium. This could result from: Pregnancy. Taking diuretic medicines. Follow these instructions at home: Eating and drinking Drink enough fluid to keep your urine pale yellow. Staying hydrated may help prevent cramps. Eat a healthy diet that includes plenty of nutrients to help your muscles function. A healthy diet includes fruits and vegetables, lean protein, whole grains, and low-fat or nonfat dairy products. Managing pain, stiffness, and swelling     Try massaging, stretching, and relaxing the affected muscle. Do this for several minutes at a time. If directed, put ice on areas that are sore or painful after a cramp. To do this: Put ice in a plastic bag. Place a towel between your skin and the bag. Leave the ice on for 20 minutes, 2-3 times a day. Remove the ice if your skin turns bright red. This is very important. If you cannot feel pain, heat, or cold, you have a greater risk of damage to the area. If directed, apply heat to muscles that are tense or tight. Do this before you exercise, or as often as told  by your health care provider. Use the heat source that your health care provider recommends, such as a moist heat pack or a heating pad. To do this: Place a towel between your skin and the heat source. Leave the heat on for 20-30 minutes. Remove the heat if your skin turns bright red. This is especially important if you are unable to feel pain, heat, or cold. You may have a greater risk of getting burned. Try taking hot showers or baths to help relax tight muscles. General instructions If you are having frequent leg cramps, avoid intense exercise for several days. Take over-the-counter and prescription medicines only as told by your health care provider. Keep all follow-up visits. This is important. Contact a health care provider if: Your leg cramps get more severe or more frequent, or they do not improve over time. Your foot becomes cold, numb, or blue. Summary Muscle cramps can develop in any muscle, but the most common place is in the calf muscles of the leg. Leg cramps are painful, and they may last for a few seconds to a few minutes. Usually, leg cramps are not caused by a serious medical problem. Often, the cause is not known. Stay hydrated, and take over-the-counter and prescription medicines only as told by your health care provider. This information is not intended to replace advice given to you by your health care provider. Make sure you discuss any questions you have with your health care provider. Document Revised: 06/11/2019 Document Reviewed: 06/11/2019 Elsevier Patient Education  2023 [employer]  

## 2021-09-21 ENCOUNTER — Ambulatory Visit: Payer: Medicaid Other | Admitting: Nurse Practitioner

## 2021-09-21 ENCOUNTER — Encounter: Payer: Self-pay | Admitting: Nurse Practitioner

## 2021-09-21 VITALS — BP 107/75 | HR 77 | Temp 98.3°F | Wt 112.0 lb

## 2021-09-21 DIAGNOSIS — M797 Fibromyalgia: Secondary | ICD-10-CM

## 2021-09-21 DIAGNOSIS — M7061 Trochanteric bursitis, right hip: Secondary | ICD-10-CM | POA: Diagnosis not present

## 2021-09-21 DIAGNOSIS — M25511 Pain in right shoulder: Secondary | ICD-10-CM | POA: Insufficient documentation

## 2021-09-21 MED ORDER — TRAMADOL HCL 50 MG PO TABS: 25.0000 mg | ORAL_TABLET | Freq: Every day | ORAL | 0 refills | Status: AC | PRN

## 2021-09-21 NOTE — Assessment & Plan Note (Signed)
Acute, will refill Tramadol, which she rarely uses (last fill was > 1 year ago).  #15 tablets provided, if wishes to continue these occasional fills will obtain UDS and controlled subs agreement.  Will place referral to ortho to further assess, [facility] as she sees Dr. Yves Dill there.  Continue massage therapy and stretching at home.  May benefit from PT.

## 2021-09-21 NOTE — Assessment & Plan Note (Signed)
Currently with acute worsening of right hip pain, will refill Tramadol, which she rarely uses (last fill was > 1 year ago).  #15 tablets provided, if wishes to continue these occasional fills will obtain UDS and controlled subs agreement.  Will place referral to ortho to further assess, [facility] as she sees Dr. Yves Dill there.  May benefit from MRI, recent x-ray with Dr. Yves Dill was normal per her report (unable to see full report in Epic).  Continue massage therapy and stretching at home.

## 2021-09-21 NOTE — Assessment & Plan Note (Signed)
Chronic, ongoing.  Continue current medication regimen.  Continue collaboration visits with [facility].  Short refill of Tramadol sent, has not used in over one year and minimally uses at baseline.

## 2021-09-21 NOTE — Progress Notes (Signed)
BP 107/75   Pulse 77   Temp 98.3 F (36.8 C) (Oral)   Wt 112 lb (50.8 kg)   SpO2 98%   BMI 17.28 kg/m    Subjective:    Patient ID: Wanda Blanchard, female    DOB: [birth year], 46 y.o.   MRN: [id number]  HPI: Wanda Blanchard is a 46 y.o. female  Chief Complaint  Patient presents with   Hip Pain   Leg Pain    Gets cortisone shots per patient by Dr. Sharlet Salina at [facility]. Patient states pain is unbearable, feels pulsating sensation down legs. Would like to discuss iliac artery and POTS. Patient states she took a tramadol today due to pain.    Shoulder Pain     R shoulder. Denies recent fall/injury    FIBROMYALGIA Is still followed by Dr. Sharlet Salina at [facility] - gets cortisone shots to lumbar spine.  If does not get enough sleep will have a flare.  Currently having pain right pain and hip, [facility] goes there for massage = her massage therapist told her to look into iliac artery with POTS syndrome (?May Turner Syndrome).  She gets MRIs from Dr. Sharlet Salina, often only lumbar -- she wonders if she needs hip and right upper leg -- did have x-ray in January with them.  She reports this current pain is not like her sciatic pain, a pulsating throb.    Current pain right hip, right leg, and right shoulder pain (will catch sometimes with reaching up + can not lie on right side).  Did recently take a Tramadol, which she rarely takes to help with pain.  Muscle relaxer and massage not causing 100% relief.  No recent falls or injuries. Pain status: uncontrolled Satisfied with current treatment?: yes Medication side effects: no Medication compliance: good compliance Duration: 6 to 8 months Location: right hip, right leg, and right shoulder pain Quality: dull, aching, and throbbing Current pain level:  no pain at present at visit today  -- took a Tramadol Previous pain level: 9/10 Aggravating factors: prolonged activity and lack of sleep Alleviating factors: Tramadol, massage, injections Previous pain  specialty evaluation: no Non-narcotic analgesic meds: yes Narcotic contract: not at present Treatments attempted: injections, heating pad, massage, Tramadol      09/21/2021    3:57 PM 01/03/2021    2:06 PM 06/08/2020    2:10 PM 05/19/2019    1:50 PM 11/26/2018   10:58 AM  Depression screen PHQ 2/9  Decreased Interest 0 '1 1 1 1  ' Down, Depressed, Hopeless '1 1 1 1 1  ' PHQ - 2 Score '1 2 2 2 2  ' Altered sleeping '2 1 2 1 1  ' Tired, decreased energy '3 2 2 1 1  ' Change in appetite '3 3 3 3 1  ' Feeling bad or failure about yourself  0 '1 1 1 1  ' Trouble concentrating 1 0 0 1 0  Moving slowly or fidgety/restless 0 0 0 0 1  Suicidal thoughts 0 0 0 0 2  PHQ-9 Score '10 9 10 9 9  ' Difficult doing work/chores Somewhat difficult   Somewhat difficult Somewhat difficult       09/21/2021    3:58 PM 01/03/2021    2:07 PM 06/08/2020    2:12 PM 05/19/2019    1:49 PM  GAD 7 : Generalized Anxiety Score  Nervous, Anxious, on Edge '1 1 1 1  ' Control/stop worrying '1 1 1 2  ' Worry too much - different things '2 1 1 ' 2  Trouble relaxing 2 1 0 1  Restless 1 0 1 0  Easily annoyed or irritable 0 1 0 2  Afraid - awful might happen 1 2 0 1  Total GAD 7 Score '8 7 4 9  ' Anxiety Difficulty Somewhat difficult   Somewhat difficult    Relevant past medical, surgical, family and social history reviewed and updated as indicated. Interim medical history since our last visit reviewed. Allergies and medications reviewed and updated.  Review of Systems  Constitutional:  Negative for activity change, appetite change, diaphoresis, fatigue and fever.  Respiratory:  Negative for cough, chest tightness and shortness of breath.   Cardiovascular:  Negative for chest pain, palpitations and leg swelling.  Gastrointestinal: Negative.   Musculoskeletal:  Positive for arthralgias.  Neurological: Negative.   Psychiatric/Behavioral: Negative.      Per HPI unless specifically indicated above     Objective:    BP 107/75   Pulse 77   Temp  98.3 F (36.8 C) (Oral)   Wt 112 lb (50.8 kg)   SpO2 98%   BMI 17.28 kg/m   Wt Readings from Last 3 Encounters:  09/21/21 112 lb (50.8 kg)  07/05/21 110 lb (49.9 kg)  01/03/21 110 lb 3.2 oz (50 kg)    Physical Exam Vitals and nursing note reviewed.  Constitutional:      General: She is awake. She is not in acute distress.    Appearance: She is well-developed. She is not ill-appearing.  HENT:     Head: Normocephalic.     Right Ear: Hearing normal.     Left Ear: Hearing normal.     Nose: Nose normal.     Mouth/Throat:     Mouth: Mucous membranes are moist.  Eyes:     General: Lids are normal.        Right eye: No discharge.        Left eye: No discharge.     Conjunctiva/sclera: Conjunctivae normal.     Pupils: Pupils are equal, round, and reactive to light.  Neck:     Thyroid: No thyromegaly.     Vascular: No carotid bruit or JVD.  Cardiovascular:     Rate and Rhythm: Normal rate and regular rhythm.     Heart sounds: Normal heart sounds. No murmur heard.    No gallop.  Pulmonary:     Effort: Pulmonary effort is normal. No accessory muscle usage or respiratory distress.     Breath sounds: Normal breath sounds.  Abdominal:     General: Bowel sounds are normal.     Palpations: Abdomen is soft. There is no hepatomegaly or splenomegaly.  Musculoskeletal:     Right shoulder: Tenderness (posterior shoulder) present. No swelling, laceration, bony tenderness or crepitus. Decreased range of motion. Decreased strength.     Left shoulder: Normal.     Cervical back: Normal range of motion and neck supple.     Right hip: Tenderness present. No deformity, bony tenderness or crepitus. Normal range of motion. Decreased strength.     Left hip: Normal.     Right lower leg: No edema.     Left lower leg: No edema.  Lymphadenopathy:     Cervical: No cervical adenopathy.  Skin:    General: Skin is warm and dry.  Neurological:     Mental Status: She is alert and oriented to person,  place, and time.  Psychiatric:        Attention and Perception: Attention normal.  Mood and Affect: Mood normal.        Behavior: Behavior normal. Behavior is cooperative.        Thought Content: Thought content normal.        Judgment: Judgment normal.    Results for orders placed or performed in visit on 01/03/21  CBC with Differential/Platelet  Result Value Ref Range   WBC 4.2 3.4 - 10.8 x10E3/uL   RBC 4.37 3.77 - 5.28 x10E6/uL   Hemoglobin 13.2 11.1 - 15.9 g/dL   Hematocrit 39.6 34.0 - 46.6 %   MCV 91 79 - 97 fL   MCH 30.2 26.6 - 33.0 pg   MCHC 33.3 31.5 - 35.7 g/dL   RDW 12.2 11.7 - 15.4 %   Platelets 271 150 - 450 x10E3/uL   Neutrophils 65 Not Estab. %   Lymphs 26 Not Estab. %   Monocytes 7 Not Estab. %   Eos 1 Not Estab. %   Basos 1 Not Estab. %   Neutrophils Absolute 2.7 1.4 - 7.0 x10E3/uL   Lymphocytes Absolute 1.1 0.7 - 3.1 x10E3/uL   Monocytes Absolute 0.3 0.1 - 0.9 x10E3/uL   EOS (ABSOLUTE) 0.1 0.0 - 0.4 x10E3/uL   Basophils Absolute 0.1 0.0 - 0.2 x10E3/uL   Immature Granulocytes 0 Not Estab. %   Immature Grans (Abs) 0.0 0.0 - 0.1 x10E3/uL  Comprehensive metabolic panel  Result Value Ref Range   Glucose 73 70 - 99 mg/dL   BUN 12 6 - 24 mg/dL   Creatinine, Ser 0.90 0.57 - 1.00 mg/dL   eGFR 81 >59 mL/min/1.73   BUN/Creatinine Ratio 13 9 - 23   Sodium 140 134 - 144 mmol/L   Potassium 3.9 3.5 - 5.2 mmol/L   Chloride 103 96 - 106 mmol/L   CO2 24 20 - 29 mmol/L   Calcium 8.9 8.7 - 10.2 mg/dL   Total Protein 6.3 6.0 - 8.5 g/dL   Albumin 4.5 3.8 - 4.8 g/dL   Globulin, Total 1.8 1.5 - 4.5 g/dL   Albumin/Globulin Ratio 2.5 (H) 1.2 - 2.2   Bilirubin Total 0.3 0.0 - 1.2 mg/dL   Alkaline Phosphatase 50 44 - 121 IU/L   AST 12 0 - 40 IU/L   ALT 13 0 - 32 IU/L  Lipid Panel w/o Chol/HDL Ratio  Result Value Ref Range   Cholesterol, Total 152 100 - 199 mg/dL   Triglycerides 88 0 - 149 mg/dL   HDL 62 >39 mg/dL   VLDL Cholesterol Cal 16 5 - 40 mg/dL   LDL Chol  Calc (NIH) 74 0 - 99 mg/dL  TSH  Result Value Ref Range   TSH 2.110 0.450 - 4.500 uIU/mL  VITAMIN D 25 Hydroxy (Vit-D Deficiency, Fractures)  Result Value Ref Range   Vit D, 25-Hydroxy 74.6 30.0 - 100.0 ng/mL  Vitamin B12  Result Value Ref Range   Vitamin B-12 787 232 - 1,245 pg/mL  ANA w/Reflex if Positive  Result Value Ref Range   Anti Nuclear Antibody (ANA) Negative Negative  C-reactive protein  Result Value Ref Range   CRP 2 0 - 10 mg/L  Sed Rate (ESR)  Result Value Ref Range   Sed Rate 2 0 - 32 mm/hr      Assessment & Plan:   Problem List Items Addressed This Visit       Musculoskeletal and Integument   Bursitis of right hip    Currently with acute worsening of right hip pain, will refill Tramadol, which she rarely  uses (last fill was > 1 year ago).  #15 tablets provided, if wishes to continue these occasional fills will obtain UDS and controlled subs agreement.  Will place referral to ortho to further assess, [facility] as she sees Dr. Sharlet Salina there.  May benefit from MRI, recent x-ray with Dr. Sharlet Salina was normal per her report (unable to see full report in Epic).  Continue massage therapy and stretching at home.      Relevant Medications   traMADol (ULTRAM) 50 MG tablet   Other Relevant Orders   Ambulatory referral to Orthopedics     Other   Acute pain of right shoulder    Acute, will refill Tramadol, which she rarely uses (last fill was > 1 year ago).  #15 tablets provided, if wishes to continue these occasional fills will obtain UDS and controlled subs agreement.  Will place referral to ortho to further assess, [facility] as she sees Dr. Sharlet Salina there.  Continue massage therapy and stretching at home.  May benefit from PT.      Relevant Medications   traMADol (ULTRAM) 50 MG tablet   Other Relevant Orders   Ambulatory referral to Orthopedics   Fibromyalgia - Primary    Chronic, ongoing.  Continue current medication regimen.  Continue collaboration  visits with [facility].  Short refill of Tramadol sent, has not used in over one year and minimally uses at baseline.      Relevant Medications   lamoTRIgine (LAMICTAL) 100 MG tablet   traMADol (ULTRAM) 50 MG tablet   Other Relevant Orders   Ambulatory referral to Orthopedics     Follow up plan: Return for as scheduled.

## 2021-09-23 ENCOUNTER — Other Ambulatory Visit: Payer: Self-pay | Admitting: Nurse Practitioner

## 2021-09-23 DIAGNOSIS — F3132 Bipolar disorder, current episode depressed, moderate: Secondary | ICD-10-CM | POA: Diagnosis not present

## 2021-09-23 NOTE — Telephone Encounter (Signed)
Requested Prescriptions  Pending Prescriptions Disp Refills  . HAILEY 24 FE 1-20 MG-MCG(24) tablet [Pharmacy Med Name: HAILEY 24 FE 1 MG-20 MCG TAB] 84 tablet 1    Sig: TAKE 1 TABLET BY MOUTH EVERY DAY     OB/GYN:  Contraceptives Passed - 09/23/2021  4:32 PM      Passed - Last BP in normal range    BP Readings from Last 1 Encounters:  09/21/21 107/75         Passed - Valid encounter within last 12 months    Recent Outpatient Visits          2 days ago Fibromyalgia   [facility] Conner, Corrie Dandy T, NP   2 months ago Bipolar I disorder (HCC)   [facility] Cannady, Dorie Rank, NP   8 months ago Bipolar I disorder (HCC)   [facility] Cannady, Dorie Rank, NP   1 year ago Irregular menstrual cycle   [facility] [place], [place] T, NP   1 year ago Irregular menstrual cycle   [facility] [place], Dorie Rank, NP      Future Appointments            In 3 months Cannady, Dorie Rank, NP [employer]           Passed - Patient is not a smoker

## 2021-09-26 ENCOUNTER — Telehealth: Payer: Self-pay

## 2021-09-26 NOTE — Telephone Encounter (Signed)
PA for Tramadol initiated and submitted via Cover My Meds. Key: [id number]

## 2021-09-27 DIAGNOSIS — M41117 Juvenile idiopathic scoliosis, lumbosacral region: Secondary | ICD-10-CM | POA: Diagnosis not present

## 2021-09-27 DIAGNOSIS — M9903 Segmental and somatic dysfunction of lumbar region: Secondary | ICD-10-CM | POA: Diagnosis not present

## 2021-09-27 DIAGNOSIS — M5431 Sciatica, right side: Secondary | ICD-10-CM | POA: Diagnosis not present

## 2021-09-27 DIAGNOSIS — M5412 Radiculopathy, cervical region: Secondary | ICD-10-CM | POA: Diagnosis not present

## 2021-09-27 DIAGNOSIS — M9901 Segmental and somatic dysfunction of cervical region: Secondary | ICD-10-CM | POA: Diagnosis not present

## 2021-09-27 DIAGNOSIS — M9905 Segmental and somatic dysfunction of pelvic region: Secondary | ICD-10-CM | POA: Diagnosis not present

## 2021-09-30 ENCOUNTER — Telehealth: Payer: Medicaid Other | Admitting: Physician Assistant

## 2021-09-30 DIAGNOSIS — R3989 Other symptoms and signs involving the genitourinary system: Secondary | ICD-10-CM

## 2021-09-30 MED ORDER — CEPHALEXIN 500 MG PO CAPS: 500.0000 mg | ORAL_CAPSULE | Freq: Two times a day (BID) | ORAL | 0 refills | Status: DC

## 2021-09-30 NOTE — Progress Notes (Signed)

## 2021-10-05 ENCOUNTER — Encounter: Payer: Self-pay | Admitting: Nurse Practitioner

## 2021-10-06 MED ORDER — FLUCONAZOLE 150 MG PO TABS: 150.0000 mg | ORAL_TABLET | Freq: Once | ORAL | 0 refills | Status: AC

## 2021-10-14 DIAGNOSIS — M25551 Pain in right hip: Secondary | ICD-10-CM | POA: Diagnosis not present

## 2021-10-14 DIAGNOSIS — M5030 Other cervical disc degeneration, unspecified cervical region: Secondary | ICD-10-CM | POA: Diagnosis not present

## 2021-10-14 DIAGNOSIS — M5416 Radiculopathy, lumbar region: Secondary | ICD-10-CM | POA: Diagnosis not present

## 2021-10-14 DIAGNOSIS — M62830 Muscle spasm of back: Secondary | ICD-10-CM | POA: Diagnosis not present

## 2021-10-14 DIAGNOSIS — M48062 Spinal stenosis, lumbar region with neurogenic claudication: Secondary | ICD-10-CM | POA: Diagnosis not present

## 2021-10-14 DIAGNOSIS — M542 Cervicalgia: Secondary | ICD-10-CM | POA: Diagnosis not present

## 2021-10-14 DIAGNOSIS — G8929 Other chronic pain: Secondary | ICD-10-CM | POA: Diagnosis not present

## 2021-10-14 DIAGNOSIS — M549 Dorsalgia, unspecified: Secondary | ICD-10-CM | POA: Diagnosis not present

## 2021-10-14 DIAGNOSIS — M5412 Radiculopathy, cervical region: Secondary | ICD-10-CM | POA: Diagnosis not present

## 2021-10-14 DIAGNOSIS — M5136 Other intervertebral disc degeneration, lumbar region: Secondary | ICD-10-CM | POA: Diagnosis not present

## 2021-10-17 ENCOUNTER — Other Ambulatory Visit: Payer: Self-pay | Admitting: Family Medicine

## 2021-10-17 DIAGNOSIS — M25551 Pain in right hip: Secondary | ICD-10-CM

## 2021-10-25 DIAGNOSIS — M41117 Juvenile idiopathic scoliosis, lumbosacral region: Secondary | ICD-10-CM | POA: Diagnosis not present

## 2021-10-25 DIAGNOSIS — M5412 Radiculopathy, cervical region: Secondary | ICD-10-CM | POA: Diagnosis not present

## 2021-10-25 DIAGNOSIS — M9901 Segmental and somatic dysfunction of cervical region: Secondary | ICD-10-CM | POA: Diagnosis not present

## 2021-10-25 DIAGNOSIS — M9903 Segmental and somatic dysfunction of lumbar region: Secondary | ICD-10-CM | POA: Diagnosis not present

## 2021-10-25 DIAGNOSIS — M9905 Segmental and somatic dysfunction of pelvic region: Secondary | ICD-10-CM | POA: Diagnosis not present

## 2021-10-25 DIAGNOSIS — M5431 Sciatica, right side: Secondary | ICD-10-CM | POA: Diagnosis not present

## 2021-11-01 ENCOUNTER — Ambulatory Visit
Admission: RE | Admit: 2021-11-01 | Discharge: 2021-11-01 | Disposition: A | Discharge status: Home / Self Care | Payer: Medicaid Other | Source: Ambulatory Visit | Attending: Family Medicine | Admitting: Family Medicine

## 2021-11-01 DIAGNOSIS — M25551 Pain in right hip: Secondary | ICD-10-CM

## 2021-11-09 DIAGNOSIS — S73191A Other sprain of right hip, initial encounter: Secondary | ICD-10-CM | POA: Diagnosis not present

## 2021-11-09 DIAGNOSIS — M25551 Pain in right hip: Secondary | ICD-10-CM | POA: Diagnosis not present

## 2021-11-15 ENCOUNTER — Encounter: Payer: Self-pay | Admitting: Nurse Practitioner

## 2021-11-16 MED ORDER — VALACYCLOVIR HCL 1 G PO TABS: 2000.0000 mg | ORAL_TABLET | Freq: Two times a day (BID) | ORAL | 6 refills | Status: DC

## 2021-11-22 DIAGNOSIS — M5431 Sciatica, right side: Secondary | ICD-10-CM | POA: Diagnosis not present

## 2021-11-22 DIAGNOSIS — M9901 Segmental and somatic dysfunction of cervical region: Secondary | ICD-10-CM | POA: Diagnosis not present

## 2021-11-22 DIAGNOSIS — M41117 Juvenile idiopathic scoliosis, lumbosacral region: Secondary | ICD-10-CM | POA: Diagnosis not present

## 2021-11-22 DIAGNOSIS — M9903 Segmental and somatic dysfunction of lumbar region: Secondary | ICD-10-CM | POA: Diagnosis not present

## 2021-11-22 DIAGNOSIS — M9905 Segmental and somatic dysfunction of pelvic region: Secondary | ICD-10-CM | POA: Diagnosis not present

## 2021-11-22 DIAGNOSIS — M5412 Radiculopathy, cervical region: Secondary | ICD-10-CM | POA: Diagnosis not present

## 2021-12-02 ENCOUNTER — Encounter: Payer: Self-pay | Admitting: Nurse Practitioner

## 2022-01-01 NOTE — Patient Instructions (Signed)
Hip Pain The hip is the joint between the upper legs and the lower pelvis. The bones, cartilage, tendons, and muscles of your hip joint support your body and allow you to move around. Hip pain can range from a minor ache to severe pain in one or both of your hips. The pain may be felt on the inside of the hip joint near the groin, or on the outside near the buttocks and upper thigh. You may also have swelling or stiffness in your hip area. Follow these instructions at home: Managing pain, stiffness, and swelling     If directed, put ice on the painful area. To do this: Put ice in a plastic bag. Place a towel between your skin and the bag. Leave the ice on for 20 minutes, 2-3 times a day. If directed, apply heat to the affected area as often as told by your health care provider. Use the heat source that your health care provider recommends, such as a moist heat pack or a heating pad. Place a towel between your skin and the heat source. Leave the heat on for 20-30 minutes. Remove the heat if your skin turns bright red. This is especially important if you are unable to feel pain, heat, or cold. You may have a greater risk of getting burned. Activity Do exercises as told by your health care provider. Avoid activities that cause pain. General instructions  Take over-the-counter and prescription medicines only as told by your health care provider. Keep a journal of your symptoms. Write down: How often you have hip pain. The location of your pain. What the pain feels like. What makes the pain worse. Sleep with a pillow between your legs on your most comfortable side. Keep all follow-up visits as told by your health care provider. This is important. Contact a health care provider if: You cannot put weight on your leg. Your pain or swelling continues or gets worse after one week. It gets harder to walk. You have a fever. Get help right away if: You fall. You have a sudden increase in pain  and swelling in your hip. Your hip is red or swollen or very tender to touch. Summary Hip pain can range from a minor ache to severe pain in one or both of your hips. The pain may be felt on the inside of the hip joint near the groin, or on the outside near the buttocks and upper thigh. Avoid activities that cause pain. Write down how often you have hip pain, the location of the pain, what makes it worse, and what it feels like. This information is not intended to replace advice given to you by your health care provider. Make sure you discuss any questions you have with your health care provider. Document Revised: 06/10/2018 Document Reviewed: 06/10/2018 Elsevier Patient Education  2023 [employer]  

## 2022-01-04 ENCOUNTER — Ambulatory Visit (INDEPENDENT_AMBULATORY_CARE_PROVIDER_SITE_OTHER): Payer: Medicaid Other | Admitting: Nurse Practitioner

## 2022-01-04 ENCOUNTER — Encounter: Payer: Self-pay | Admitting: Nurse Practitioner

## 2022-01-04 VITALS — BP 104/60 | HR 82 | Temp 98.0°F | Ht 67.52 in | Wt 108.2 lb

## 2022-01-04 DIAGNOSIS — Z13220 Encounter for screening for lipoid disorders: Secondary | ICD-10-CM | POA: Diagnosis not present

## 2022-01-04 DIAGNOSIS — F319 Bipolar disorder, unspecified: Secondary | ICD-10-CM | POA: Diagnosis not present

## 2022-01-04 DIAGNOSIS — Z0000 Encounter for general adult medical examination without abnormal findings: Secondary | ICD-10-CM

## 2022-01-04 DIAGNOSIS — Z23 Encounter for immunization: Secondary | ICD-10-CM | POA: Diagnosis not present

## 2022-01-04 DIAGNOSIS — F419 Anxiety disorder, unspecified: Secondary | ICD-10-CM | POA: Diagnosis not present

## 2022-01-04 DIAGNOSIS — E559 Vitamin D deficiency, unspecified: Secondary | ICD-10-CM | POA: Diagnosis not present

## 2022-01-04 DIAGNOSIS — Z136 Encounter for screening for cardiovascular disorders: Secondary | ICD-10-CM

## 2022-01-04 DIAGNOSIS — M797 Fibromyalgia: Secondary | ICD-10-CM

## 2022-01-04 DIAGNOSIS — G43109 Migraine with aura, not intractable, without status migrainosus: Secondary | ICD-10-CM

## 2022-01-04 MED ORDER — HAILEY 24 FE 1-20 MG-MCG(24) PO TABS: 1.0000 | ORAL_TABLET | Freq: Every day | ORAL | 4 refills | Status: DC

## 2022-01-04 NOTE — Assessment & Plan Note (Addendum)
Chronic, stable.  Minimal episodes.  Imitrex refills up to date.

## 2022-01-04 NOTE — Progress Notes (Signed)
BP 104/60   Pulse 82   Temp 98 F (36.7 C) (Oral)   Ht 5' 7.52" (1.715 m)   Wt 108 lb 3.2 oz (49.1 kg)   SpO2 98%   BMI 16.69 kg/m    Subjective:    Patient ID: Wanda Blanchard, female    DOB: 21-Feb-1975, 46 y.o.   MRN: [id number]  HPI: Wanda Blanchard is a 46 y.o. female presenting on 01/04/2022 for comprehensive medical examination. Current medical complaints include:none  She currently lives with: spouse Menopausal Symptoms: no  FIBROMYALGIA Is followed by Dr. Sharlet Salina at [facility] and orth[facility] at this time. Pain status: stable Satisfied with current treatment?: yes Medication side effects: no Medication compliance: good compliance Duration:  Location:  Quality: aching Current pain level: mild Aggravating factors: none Alleviating factors: ice, heat, NSAIDs, and muscle relaxer Non-narcotic analgesic meds: yes Narcotic contract:yes Treatments attempted: ice, heat, and ibuprofen, Tramadol  ANXIETY/STRESS Followed by psychiatry and receives all medications through them. Duration:controlled Anxious mood: no  Excessive worrying: no Irritability: no  Sweating: no Nausea: no Palpitations: no Hyperventilation: no Panic attacks: no Agoraphobia: no  Obscessions/compulsions: no Depressed mood: no    01/04/2022   10:37 AM 09/21/2021    3:57 PM 01/03/2021    2:06 PM 06/08/2020    2:10 PM 05/19/2019    1:50 PM  Depression screen PHQ 2/9  Decreased Interest 1 0 _0 Down, Depressed, Hopeless _1 PHQ - 2 Score _2 Altered sleeping _3 Tired, decreased energy _4 Change in appetite _5 Feeling bad or failure about yourself  0 0 _6 Trouble concentrating 0 1 0 0 1  Moving slowly or fidgety/restless 0 0 0 0 0  Suicidal thoughts 0 0 0 0 0  PHQ-9 Score _7 Difficult doing work/chores Somewhat difficult Somewhat difficult   Somewhat difficult       01/04/2022   10:38 AM 09/21/2021    3:58 PM 01/03/2021    2:07 PM  06/08/2020    2:12 PM  GAD 7 : Generalized Anxiety Score  Nervous, Anxious, on Edge _8 Control/stop worrying 0 _9 Worry too much - different things _10 Trouble relaxing 0 2 1 0  Restless 0 1 0 1  Easily annoyed or irritable 1 0 1 0  Afraid - awful might happen _11 0  Total GAD 7 Score _12 Anxiety Difficulty Somewhat difficult Somewhat difficult       The patient does not have a history of falls. I did not complete a risk assessment for falls. A plan of care for falls was not documented.   Past Medical History:  Past Medical History:  Diagnosis Date   Anxiety    Constipation    Depression    Fibromyalgia    Insomnia    Migraine    Myalgia and myositis    Ruptured ovarian cyst    Whole body pain     Surgical History:  Past Surgical History:  Procedure Laterality Date   CESAREAN SECTION     e sure     PLACEMENT OF BREAST IMPLANTS  2009    Medications:  Current Outpatient Medications on File Prior to Visit  Medication Sig   BIOTIN 5000  PO Take 5,000 mg by mouth daily.   Cholecalciferol (VITAMIN D3) 5000 UNITS TABS Take 5,000 Units by mouth daily.   clonazePAM (KLONOPIN) 0.5 MG tablet TAKE 1 TABLET (0.5 MG TOTAL) BY MOUTH DAILY AS NEEDED.   lamoTRIgine (LAMICTAL) 100 MG tablet Take 100 mg by mouth every morning.   Multiple Vitamins-Minerals (WOMENS MULTIVITAMIN PLUS PO) Take by mouth daily.   QUEtiapine (SEROQUEL XR) 50 MG TB24 24 hr tablet Take 50 mg by mouth at bedtime.   SUMAtriptan (IMITREX) 50 MG tablet Take 1 tablet (50 mg total) by mouth every 2 (two) hours as needed for migraine. May repeat in 2 hours if headache persists or recurs.   traMADol (ULTRAM) 50 MG tablet Take 0.5 tablets (25 mg total) by mouth daily as needed.   Current Facility-Administered Medications on File Prior to Visit  Medication   betamethasone acetate-betamethasone sodium phosphate (CELESTONE) injection 3 mg    Allergies:  No Known Allergies  Social History:   Social History   Socioeconomic History   Marital status: Married    Spouse name: Not on file   Number of children: Not on file   Years of education: Not on file   Highest education level: Not on file  Occupational History   Not on file  Tobacco Use   Smoking status: Former    Types: Cigarettes    Quit date: 04/21/2014    Years since quitting: 7.7   Smokeless tobacco: Never  Vaping Use   Vaping Use: Every day  Substance and Sexual Activity   Alcohol use: Yes    Alcohol/week: 0.0 standard drinks of alcohol    Comment: pt states every now and then   Drug use: No   Sexual activity: Yes    Birth control/protection: Other-see comments    Comment: e-sure  Other Topics Concern   Not on file  Social History Narrative   Not on file   Social Determinants of Health   Financial Resource Strain: Not on file  Food Insecurity: Not on file  Transportation Needs: Not on file  Physical Activity: Not on file  Stress: Not on file  Social Connections: Not on file  Intimate Partner Violence: Not on file   Social History   Tobacco Use  Smoking Status Former   Types: Cigarettes   Quit date: 04/21/2014   Years since quitting: 7.7  Smokeless Tobacco Never   Social History   Substance and Sexual Activity  Alcohol Use Yes   Alcohol/week: 0.0 standard drinks of alcohol   Comment: pt states every now and then    Family History:  Family History  Problem Relation Age of Onset   Migraines Mother    Cancer Father        prostate   Heart disease Father    Lung disease Father    Autism Son    Stroke Paternal Aunt    Diabetes Paternal Uncle     Past medical history, surgical history, medications, allergies, family history and social history reviewed with patient today and changes made to appropriate areas of the chart.   ROS All other ROS negative except what is listed above and in the HPI.      Objective:    BP 104/60   Pulse 82   Temp 98 F (36.7 C) (Oral)   Ht 5' 7.52"  (1.715 m)   Wt 108 lb 3.2 oz (49.1 kg)   SpO2 98%   BMI 16.69 kg/m   Wt Readings from Last  3 Encounters:  01/04/22 108 lb 3.2 oz (49.1 kg)  09/21/21 112 lb (50.8 kg)  07/05/21 110 lb (49.9 kg)    Physical Exam Vitals and nursing note reviewed. Exam conducted with a chaperone present.  Constitutional:      General: She is awake. She is not in acute distress.    Appearance: She is well-developed and well-groomed. She is not ill-appearing or toxic-appearing.  HENT:     Head: Normocephalic and atraumatic.     Right Ear: Hearing, tympanic membrane, ear canal and external ear normal. No drainage.     Left Ear: Hearing, tympanic membrane, ear canal and external ear normal. No drainage.     Nose: Nose normal.     Right Sinus: No maxillary sinus tenderness or frontal sinus tenderness.     Left Sinus: No maxillary sinus tenderness or frontal sinus tenderness.     Mouth/Throat:     Mouth: Mucous membranes are moist.     Pharynx: Oropharynx is clear. Uvula midline. No pharyngeal swelling, oropharyngeal exudate or posterior oropharyngeal erythema.  Eyes:     General: Lids are normal.        Right eye: No discharge.        Left eye: No discharge.     Extraocular Movements: Extraocular movements intact.     Conjunctiva/sclera: Conjunctivae normal.     Pupils: Pupils are equal, round, and reactive to light.     Visual Fields: Right eye visual fields normal and left eye visual fields normal.  Neck:     Thyroid: No thyromegaly.     Vascular: No carotid bruit.     Trachea: Trachea normal.  Cardiovascular:     Rate and Rhythm: Normal rate and regular rhythm.     Heart sounds: Normal heart sounds. No murmur heard.    No gallop.  Pulmonary:     Effort: Pulmonary effort is normal. No accessory muscle usage or respiratory distress.     Breath sounds: Normal breath sounds.  Abdominal:     General: Bowel sounds are normal.     Palpations: Abdomen is soft. There is no hepatomegaly or splenomegaly.      Tenderness: There is no abdominal tenderness.  Musculoskeletal:        General: Normal range of motion.     Cervical back: Normal range of motion and neck supple.     Right lower leg: No edema.     Left lower leg: No edema.  Lymphadenopathy:     Head:     Right side of head: No submental, submandibular, tonsillar, preauricular or posterior auricular adenopathy.     Left side of head: No submental, submandibular, tonsillar, preauricular or posterior auricular adenopathy.     Cervical: No cervical adenopathy.  Skin:    General: Skin is warm and dry.     Capillary Refill: Capillary refill takes less than 2 seconds.     Findings: No rash.  Neurological:     Mental Status: She is alert and oriented to person, place, and time.     Gait: Gait is intact.     Deep Tendon Reflexes: Reflexes are normal and symmetric.     Reflex Scores:      Brachioradialis reflexes are 2+ on the right side and 2+ on the left side.      Patellar reflexes are 2+ on the right side and 2+ on the left side. Psychiatric:        Attention and Perception: Attention normal.  Mood and Affect: Mood normal.        Speech: Speech normal.        Behavior: Behavior normal. Behavior is cooperative.        Thought Content: Thought content normal.        Judgment: Judgment normal.    Results for orders placed or performed in visit on 01/03/21  CBC with Differential/Platelet  Result Value Ref Range   WBC 4.2 3.4 - 10.8 x10E3/uL   RBC 4.37 3.77 - 5.28 x10E6/uL   Hemoglobin 13.2 11.1 - 15.9 g/dL   Hematocrit 39.6 34.0 - 46.6 %   MCV 91 79 - 97 fL   MCH 30.2 26.6 - 33.0 pg   MCHC 33.3 31.5 - 35.7 g/dL   RDW 12.2 11.7 - 15.4 %   Platelets 271 150 - 450 x10E3/uL   Neutrophils 65 Not Estab. %   Lymphs 26 Not Estab. %   Monocytes 7 Not Estab. %   Eos 1 Not Estab. %   Basos 1 Not Estab. %   Neutrophils Absolute 2.7 1.4 - 7.0 x10E3/uL   Lymphocytes Absolute 1.1 0.7 - 3.1 x10E3/uL   Monocytes Absolute 0.3 0.1 -  0.9 x10E3/uL   EOS (ABSOLUTE) 0.1 0.0 - 0.4 x10E3/uL   Basophils Absolute 0.1 0.0 - 0.2 x10E3/uL   Immature Granulocytes 0 Not Estab. %   Immature Grans (Abs) 0.0 0.0 - 0.1 x10E3/uL  Comprehensive metabolic panel  Result Value Ref Range   Glucose 73 70 - 99 mg/dL   BUN 12 6 - 24 mg/dL   Creatinine, Ser 0.90 0.57 - 1.00 mg/dL   eGFR 81 >59 mL/min/1.73   BUN/Creatinine Ratio 13 9 - 23   Sodium 140 134 - 144 mmol/L   Potassium 3.9 3.5 - 5.2 mmol/L   Chloride 103 96 - 106 mmol/L   CO2 24 20 - 29 mmol/L   Calcium 8.9 8.7 - 10.2 mg/dL   Total Protein 6.3 6.0 - 8.5 g/dL   Albumin 4.5 3.8 - 4.8 g/dL   Globulin, Total 1.8 1.5 - 4.5 g/dL   Albumin/Globulin Ratio 2.5 (H) 1.2 - 2.2   Bilirubin Total 0.3 0.0 - 1.2 mg/dL   Alkaline Phosphatase 50 44 - 121 IU/L   AST 12 0 - 40 IU/L   ALT 13 0 - 32 IU/L  Lipid Panel w/o Chol/HDL Ratio  Result Value Ref Range   Cholesterol, Total 152 100 - 199 mg/dL   Triglycerides 88 0 - 149 mg/dL   HDL 62 >39 mg/dL   VLDL Cholesterol Cal 16 5 - 40 mg/dL   LDL Chol Calc (NIH) 74 0 - 99 mg/dL  TSH  Result Value Ref Range   TSH 2.110 0.450 - 4.500 uIU/mL  VITAMIN D 25 Hydroxy (Vit-D Deficiency, Fractures)  Result Value Ref Range   Vit D, 25-Hydroxy 74.6 30.0 - 100.0 ng/mL  Vitamin B12  Result Value Ref Range   Vitamin B-12 787 232 - 1,245 pg/mL  ANA w/Reflex if Positive  Result Value Ref Range   Anti Nuclear Antibody (ANA) Negative Negative  C-reactive protein  Result Value Ref Range   CRP 2 0 - 10 mg/L  Sed Rate (ESR)  Result Value Ref Range   Sed Rate 2 0 - 32 mm/hr      Assessment & Plan:   Problem List Items Addressed This Visit       Cardiovascular and Mediastinum   Migraine    Chronic, stable.  Minimal episodes.  Imitrex  refills up to date.      Relevant Orders   CBC with Differential/Platelet   Comprehensive metabolic panel     Other   Anxiety    Refer to depression plan of care.      Relevant Orders   CBC with  Differential/Platelet   TSH   Bipolar I disorder ([place]) - Primary    Chronic, ongoing.  Denies SI/HI.  Continue current medication regimen, including Lamictal, plus continue collaboration with psychiatry.  Return in 6 months for follow-up.      Relevant Orders   TSH   Fibromyalgia    Chronic, ongoing.  Continue current medication regimen.  Continue collaboration visits with [facility].  Minimal Tramadol use, minimally uses at baseline.      Relevant Orders   CBC with Differential/Platelet   Vitamin D deficiency    Low levels on past labs, check today and start supplement as needed.      Relevant Orders   VITAMIN D 25 Hydroxy (Vit-D Deficiency, Fractures)   Other Visit Diagnoses     Need for Td vaccine       Td in office today.   Relevant Orders   Td vaccine greater than or equal to 7yo preservative free IM   Encounter for lipid screening for cardiovascular disease       Lipid panel today.   Relevant Orders   Comprehensive metabolic panel   Lipid Panel w/o Chol/HDL Ratio   Encounter for annual physical exam       Annual physical today -- reviewed health maintenance.        Follow up plan: Return in about 6 months (around 07/05/2022) for MOOD AND FIBROMYALGIA -- virtual visit.   LABORATORY TESTING:  - Pap smear: up to date  IMMUNIZATIONS:   - Tdap: Tetanus vaccination status reviewed: Td vaccination indicated and given today. - Influenza: Refused - Pneumovax: Not applicable - Prevnar: Not applicable - COVID: Not applicable - HPV: Not applicable - Shingrix vaccine: Not applicable  SCREENING: -Mammogram: Refused  - Colonoscopy: Not applicable  - Bone Density: Not applicable  -Hearing Test: Not applicable  -Spirometry: Not applicable   PATIENT COUNSELING:   Advised to take 1 mg of folate supplement per day if capable of pregnancy.   Sexuality: Discussed sexually transmitted diseases, partner selection, use of condoms, avoidance of unintended pregnancy   and contraceptive alternatives.   Advised to avoid cigarette smoking.  I discussed with the patient that most people either abstain from alcohol or drink within safe limits (<=14/week and <=4 drinks/occasion for males, <=7/weeks and <= 3 drinks/occasion for females) and that the risk for alcohol disorders and other health effects rises proportionally with the number of drinks per week and how often a drinker exceeds daily limits.  Discussed cessation/primary prevention of drug use and availability of treatment for abuse.   Diet: Encouraged to adjust caloric intake to maintain  or achieve ideal body weight, to reduce intake of dietary saturated fat and total fat, to limit sodium intake by avoiding high sodium foods and not adding table salt, and to maintain adequate dietary potassium and calcium preferably from fresh fruits, vegetables, and low-fat dairy products.    Stressed the importance of regular exercise  Injury prevention: Discussed safety belts, safety helmets, smoke detector, smoking near bedding or upholstery.   Dental health: Discussed importance of regular tooth brushing, flossing, and dental visits.    NEXT PREVENTATIVE PHYSICAL DUE IN 1 YEAR. Return in about 6 months (around 07/05/2022)  for MOOD AND FIBROMYALGIA -- virtual visit.

## 2022-01-04 NOTE — Assessment & Plan Note (Signed)
Chronic, ongoing.  Denies SI/HI.  Continue current medication regimen, including Lamictal, plus continue collaboration with psychiatry.  Return in 6 months for follow-up.

## 2022-01-04 NOTE — Assessment & Plan Note (Signed)
Refer to depression plan of care. 

## 2022-01-04 NOTE — Assessment & Plan Note (Signed)
Chronic, ongoing.  Continue current medication regimen.  Continue collaboration visits with [facility].  Minimal Tramadol use, minimally uses at baseline.

## 2022-01-04 NOTE — Assessment & Plan Note (Signed)
Low levels on past labs, check today and start supplement as needed.

## 2022-01-05 LAB — COMPREHENSIVE METABOLIC PANEL
ALT: 9 IU/L (ref 0–32)
AST: 10 IU/L (ref 0–40)
Albumin/Globulin Ratio: 2.2 (ref 1.2–2.2)
Albumin: 4.1 g/dL (ref 3.9–4.9)
Alkaline Phosphatase: 58 IU/L (ref 44–121)
BUN/Creatinine Ratio: 9 (ref 9–23)
BUN: 8 mg/dL (ref 6–24)
Bilirubin Total: 0.4 mg/dL (ref 0.0–1.2)
CO2: 25 mmol/L (ref 20–29)
Calcium: 9 mg/dL (ref 8.7–10.2)
Chloride: 103 mmol/L (ref 96–106)
Creatinine, Ser: 0.85 mg/dL (ref 0.57–1.00)
Globulin, Total: 1.9 g/dL (ref 1.5–4.5)
Glucose: 92 mg/dL (ref 70–99)
Potassium: 3.8 mmol/L (ref 3.5–5.2)
Sodium: 140 mmol/L (ref 134–144)
Total Protein: 6 g/dL (ref 6.0–8.5)
eGFR: 87 mL/min/{1.73_m2} (ref >59)

## 2022-01-05 LAB — CBC WITH DIFFERENTIAL/PLATELET
Basophils Absolute: 0 10*3/uL (ref 0.0–0.2)
Basos: 1 %
EOS (ABSOLUTE): 0.1 10*3/uL (ref 0.0–0.4)
Eos: 1 %
Hematocrit: 39.1 % (ref 34.0–46.6)
Hemoglobin: 13.3 g/dL (ref 11.1–15.9)
Immature Grans (Abs): 0 10*3/uL (ref 0.0–0.1)
Immature Granulocytes: 0 %
Lymphocytes Absolute: 1.2 10*3/uL (ref 0.7–3.1)
Lymphs: 28 %
MCH: 31.3 pg (ref 26.6–33.0)
MCHC: 34 g/dL (ref 31.5–35.7)
MCV: 92 fL (ref 79–97)
Monocytes Absolute: 0.3 10*3/uL (ref 0.1–0.9)
Monocytes: 6 %
Neutrophils Absolute: 2.7 10*3/uL (ref 1.4–7.0)
Neutrophils: 64 %
Platelets: 247 10*3/uL (ref 150–450)
RBC: 4.25 x10E6/uL (ref 3.77–5.28)
RDW: 12.1 % (ref 11.7–15.4)
WBC: 4.2 10*3/uL (ref 3.4–10.8)

## 2022-01-05 LAB — VITAMIN D 25 HYDROXY (VIT D DEFICIENCY, FRACTURES): Vit D, 25-Hydroxy: 68.9 ng/mL (ref 30.0–100.0)

## 2022-01-05 LAB — TSH: TSH: 2.3 u[IU]/mL (ref 0.450–4.500)

## 2022-01-05 LAB — LIPID PANEL W/O CHOL/HDL RATIO
Cholesterol, Total: 151 mg/dL (ref 100–199)
HDL: 57 mg/dL (ref >39)
LDL Chol Calc (NIH): 75 mg/dL (ref 0–99)
Triglycerides: 108 mg/dL (ref 0–149)
VLDL Cholesterol Cal: 19 mg/dL (ref 5–40)

## 2022-01-05 NOTE — Progress Notes (Signed)
Contacted via MyChart   Good afternoon Wanda Blanchard, your labs have returned and overall look great.  I have no concerns on these. Continue all medications and supplements.  Have a great day!! Keep being amazing!!  Thank you for allowing me to participate in your care.  I appreciate you. Kindest regards, Kavari Parrillo

## 2022-01-06 ENCOUNTER — Encounter: Payer: Medicaid Other | Admitting: Nurse Practitioner

## 2022-01-19 DIAGNOSIS — F3132 Bipolar disorder, current episode depressed, moderate: Secondary | ICD-10-CM | POA: Diagnosis not present

## 2022-02-03 ENCOUNTER — Ambulatory Visit: Payer: Medicaid Other | Admitting: Nurse Practitioner

## 2022-02-03 ENCOUNTER — Encounter: Payer: Self-pay | Admitting: Nurse Practitioner

## 2022-02-03 ENCOUNTER — Other Ambulatory Visit: Payer: Self-pay | Admitting: Nurse Practitioner

## 2022-02-03 VITALS — BP 96/67 | HR 81 | Temp 98.4°F | Ht 67.52 in | Wt 107.2 lb

## 2022-02-03 DIAGNOSIS — N644 Mastodynia: Secondary | ICD-10-CM | POA: Insufficient documentation

## 2022-02-03 NOTE — Patient Instructions (Signed)
Please call to schedule your mammogram and/or bone density: [facility]  Address: [address], [place], [place] [address] Phone: [phone]  [facility] [place] [address] [place],  [place]  [address] Phone: [phone]    Breast Tenderness Breast tenderness is a common problem for women of all ages, but may also occur in men. Breast tenderness has many possible causes, including hormone changes, infections, taking certain medicines, and caffeine intake. In women, the pain usually comes and goes with the menstrual cycle, but it can also be constant. Breast tenderness may range from mild discomfort to severe pain. You may have tests, such as a mammogram or an ultrasound, to check for any unusual findings. Having breast tenderness usually does not mean that you have breast cancer. Follow these instructions at home: Managing pain and discomfort  If directed, put ice on the painful area. To do this: Put ice in a plastic bag. Place a towel between your skin and the bag. Leave the ice on for 20 minutes, 2-3 times a day. If your skin turns bright red, remove the ice right away to prevent skin damage. The risk of skin damage is higher if you cannot feel pain, heat, or cold. Wear a supportive bra or chest support: During exercise. While sleeping, if your breasts are very tender. Medicines Take over-the-counter and prescription medicines only as told by your health care provider. If the cause of your pain is an infection, you may be prescribed an antibiotic medicine. If you were prescribed antibiotics, take them as told by your health care provider. Do not stop using the antibiotic even if you start to feel better. Eating and drinking Decrease the amount of caffeine in your diet. Instead, drink more water and choose caffeine-free drinks. Your health care provider may recommend that you lessen the amount of fat in your  diet. You can do this by: Limiting fried foods. Cooking foods using methods such as baking, boiling, grilling, and broiling. General instructions  Keep a log of the days and times when your breasts are most tender. Ask your health care provider how to do breast exams at home. This will help you notice if you have an unusual growth or lump. Keep all follow-up visits. Contact a health care provider if: Any part of your breast is hard, red, and hot to the touch. This may be a sign of infection. You are a woman and have a new or painful lump in your breast that remains after your menstrual period ends. You are not breastfeeding and you have fluid, especially blood or pus, coming out of your nipples. You have a fever. Your pain does not improve or it gets worse. Your pain is interfering with your daily activities. Summary Breast tenderness may range from mild discomfort to severe pain. Breast tenderness has many possible causes, including hormone changes, infections, taking certain medicines, and caffeine intake. It can be treated with ice, wearing a supportive bra or chest support, and medicines. Make changes to your diet as told by your health care provider. This information is not intended to replace advice given to you by your health care provider. Make sure you discuss any questions you have with your health care provider. Document Revised: 04/06/2021 Document Reviewed: 04/06/2021 Elsevier Patient Education  2023 [employer]  

## 2022-02-03 NOTE — Assessment & Plan Note (Signed)
Ongoing for 3 weeks with small mass noted to area of tenderness.  Will obtain mammogram diagnostic and ultrasound of breast -- discussed with her as she is concerned having this done with implants.  She will call to schedule.  Follow-up after this.

## 2022-02-03 NOTE — Progress Notes (Signed)
BP 96/67   Pulse 81   Temp 98.4 F (36.9 C) (Oral)   Ht 5' 7.52" (1.715 m)   Wt 107 lb 3.2 oz (48.6 kg)   SpO2 96%   BMI 16.53 kg/m    Subjective:    Patient ID: Wanda Blanchard, female    DOB: [birth year], 46 y.o.   MRN: [id number]  HPI: Wanda Blanchard is a 46 y.o. female  Chief Complaint  Patient presents with   Breast Pain    Left upper breast x 3 weeks   BREAST PAIN Started three weeks ago with pain to left upper breast.  Has history of implants in November 2009.  Has not had a mammogram as of yet.  When in the shower she performed a breast exam and felt a smooth area of firmness to upper chest.  Her boyfriend recently, prior to pain, was pressing firmly on this breast and she is not sure this led to inflammation or not.  Paternal aunt history of breast cancer in 81's.  Maternal side no history of breast CA -- mother does have "lumpy" breasts.   Duration :weeks Location: left Onset: sudden Severity: 2/10 at present, but at worst is a 5-6/10 Quality: aching and tender Frequency: constant Redness: no Swelling: no Trauma: as above Breastfeeding: no Associated with menstral cycle: no Nipple discharge: no Breast lump: yes Status: fluctuating Treatments attempted: none Previous mammogram: no   Relevant past medical, surgical, family and social history reviewed and updated as indicated. Interim medical history since our last visit reviewed. Allergies and medications reviewed and updated.  Review of Systems  Constitutional:  Negative for activity change, appetite change, diaphoresis, fatigue and fever.  Respiratory:  Negative for cough, chest tightness and shortness of breath.   Cardiovascular:  Negative for chest pain, palpitations and leg swelling.  Neurological: Negative.   Psychiatric/Behavioral: Negative.     Per HPI unless specifically indicated above     Objective:    BP 96/67   Pulse 81   Temp 98.4 F (36.9 C) (Oral)   Ht 5' 7.52" (1.715 m)   Wt 107 lb 3.2  oz (48.6 kg)   SpO2 96%   BMI 16.53 kg/m   Wt Readings from Last 3 Encounters:  02/03/22 107 lb 3.2 oz (48.6 kg)  01/04/22 108 lb 3.2 oz (49.1 kg)  09/21/21 112 lb (50.8 kg)    Physical Exam Vitals and nursing note reviewed. Exam conducted with a chaperone present.  Constitutional:      General: She is awake. She is not in acute distress.    Appearance: She is well-developed. She is not ill-appearing.  HENT:     Head: Normocephalic.     Right Ear: Hearing normal.     Left Ear: Hearing normal.     Nose: Nose normal.     Mouth/Throat:     Mouth: Mucous membranes are moist.  Eyes:     General: Lids are normal.        Right eye: No discharge.        Left eye: No discharge.     Conjunctiva/sclera: Conjunctivae normal.     Pupils: Pupils are equal, round, and reactive to light.  Neck:     Thyroid: No thyromegaly.     Vascular: No carotid bruit or JVD.  Cardiovascular:     Rate and Rhythm: Normal rate and regular rhythm.     Heart sounds: Normal heart sounds. No murmur heard.    No gallop.  Pulmonary:     Effort: Pulmonary effort is normal. No accessory muscle usage or respiratory distress.     Breath sounds: Normal breath sounds.  Chest:  Breasts:    Right: Normal.     Left: Mass and tenderness (to left breast just above nipple) present. No swelling, inverted nipple or nipple discharge.       Comments: Breast implants bilaterally -- left side slightly different shape and size due to past injury. Abdominal:     General: Bowel sounds are normal. There is no distension.     Palpations: Abdomen is soft.     Tenderness: There is no abdominal tenderness.  Musculoskeletal:     Cervical back: Normal range of motion and neck supple.     Right lower leg: No edema.     Left lower leg: No edema.  Lymphadenopathy:     Cervical: No cervical adenopathy.     Upper Body:     Right upper body: No supraclavicular, axillary or pectoral adenopathy.     Left upper body: No  supraclavicular, axillary or pectoral adenopathy.  Skin:    General: Skin is warm and dry.  Neurological:     Mental Status: She is alert and oriented to person, place, and time.  Psychiatric:        Attention and Perception: Attention normal.        Mood and Affect: Mood normal.        Behavior: Behavior normal. Behavior is cooperative.        Thought Content: Thought content normal.        Judgment: Judgment normal.     Results for orders placed or performed in visit on 01/04/22  CBC with Differential/Platelet  Result Value Ref Range   WBC 4.2 3.4 - 10.8 x10E3/uL   RBC 4.25 3.77 - 5.28 x10E6/uL   Hemoglobin 13.3 11.1 - 15.9 g/dL   Hematocrit 39.1 34.0 - 46.6 %   MCV 92 79 - 97 fL   MCH 31.3 26.6 - 33.0 pg   MCHC 34.0 31.5 - 35.7 g/dL   RDW 12.1 11.7 - 15.4 %   Platelets 247 150 - 450 x10E3/uL   Neutrophils 64 Not Estab. %   Lymphs 28 Not Estab. %   Monocytes 6 Not Estab. %   Eos 1 Not Estab. %   Basos 1 Not Estab. %   Neutrophils Absolute 2.7 1.4 - 7.0 x10E3/uL   Lymphocytes Absolute 1.2 0.7 - 3.1 x10E3/uL   Monocytes Absolute 0.3 0.1 - 0.9 x10E3/uL   EOS (ABSOLUTE) 0.1 0.0 - 0.4 x10E3/uL   Basophils Absolute 0.0 0.0 - 0.2 x10E3/uL   Immature Granulocytes 0 Not Estab. %   Immature Grans (Abs) 0.0 0.0 - 0.1 x10E3/uL  Comprehensive metabolic panel  Result Value Ref Range   Glucose 92 70 - 99 mg/dL   BUN 8 6 - 24 mg/dL   Creatinine, Ser 0.85 0.57 - 1.00 mg/dL   eGFR 87 >59 mL/min/1.73   BUN/Creatinine Ratio 9 9 - 23   Sodium 140 134 - 144 mmol/L   Potassium 3.8 3.5 - 5.2 mmol/L   Chloride 103 96 - 106 mmol/L   CO2 25 20 - 29 mmol/L   Calcium 9.0 8.7 - 10.2 mg/dL   Total Protein 6.0 6.0 - 8.5 g/dL   Albumin 4.1 3.9 - 4.9 g/dL   Globulin, Total 1.9 1.5 - 4.5 g/dL   Albumin/Globulin Ratio 2.2 1.2 - 2.2   Bilirubin Total 0.4 0.0 - 1.2  mg/dL   Alkaline Phosphatase 58 44 - 121 IU/L   AST 10 0 - 40 IU/L   ALT 9 0 - 32 IU/L  Lipid Panel w/o Chol/HDL Ratio  Result  Value Ref Range   Cholesterol, Total 151 100 - 199 mg/dL   Triglycerides 108 0 - 149 mg/dL   HDL 57 >39 mg/dL   VLDL Cholesterol Cal 19 5 - 40 mg/dL   LDL Chol Calc (NIH) 75 0 - 99 mg/dL  TSH  Result Value Ref Range   TSH 2.300 0.450 - 4.500 uIU/mL  VITAMIN D 25 Hydroxy (Vit-D Deficiency, Fractures)  Result Value Ref Range   Vit D, 25-Hydroxy 68.9 30.0 - 100.0 ng/mL      Assessment & Plan:   Problem List Items Addressed This Visit       Other   Breast pain, left - Primary    Ongoing for 3 weeks with small mass noted to area of tenderness.  Will obtain mammogram diagnostic and ultrasound of breast -- discussed with her as she is concerned having this done with implants.  She will call to schedule.  Follow-up after this.      Relevant Orders   MM DIAG BREAST TOMO BILATERAL   US BREAST LTD UNI LEFT INC AXILLA     Follow up plan: Return if symptoms worsen or fail to improve.

## 2022-02-08 ENCOUNTER — Ambulatory Visit
Admission: RE | Admit: 2022-02-08 | Discharge: 2022-02-08 | Disposition: A | Discharge status: Home / Self Care | Payer: Medicaid Other | Source: Ambulatory Visit | Attending: Nurse Practitioner | Admitting: Nurse Practitioner

## 2022-02-08 DIAGNOSIS — N644 Mastodynia: Secondary | ICD-10-CM | POA: Insufficient documentation

## 2022-02-08 NOTE — Progress Notes (Signed)
Contacted via [place] afternoon [place], good news your imaging returned and showed no cancerous findings.  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  Can continue annual screenings:)

## 2022-02-09 ENCOUNTER — Encounter: Payer: Self-pay | Admitting: Nurse Practitioner

## 2022-02-09 DIAGNOSIS — Z9882 Breast implant status: Secondary | ICD-10-CM

## 2022-02-09 DIAGNOSIS — N644 Mastodynia: Secondary | ICD-10-CM

## 2022-02-14 DIAGNOSIS — Z9882 Breast implant status: Secondary | ICD-10-CM | POA: Insufficient documentation

## 2022-02-20 ENCOUNTER — Other Ambulatory Visit: Payer: Medicaid Other

## 2022-03-13 ENCOUNTER — Telehealth: Payer: Self-pay | Admitting: Nurse Practitioner

## 2022-03-13 NOTE — Telephone Encounter (Signed)
Copied from [place]. Topic: General - Other >> Mar 13, 2022  3:10 PM Oley Balm E wrote: Reason for CRM: Plastic Surgery called to request information about the previous provider that installed the patient's breast implants   Best contact: [phone]

## 2022-05-19 ENCOUNTER — Ambulatory Visit: Payer: Medicaid Other | Admitting: Plastic Surgery

## 2022-05-19 ENCOUNTER — Encounter: Payer: Self-pay | Admitting: Plastic Surgery

## 2022-05-19 VITALS — BP 117/79 | HR 76 | Ht 68.0 in | Wt 108.2 lb

## 2022-05-19 DIAGNOSIS — Z636 Dependent relative needing care at home: Secondary | ICD-10-CM

## 2022-05-19 DIAGNOSIS — F17210 Nicotine dependence, cigarettes, uncomplicated: Secondary | ICD-10-CM

## 2022-05-19 DIAGNOSIS — F319 Bipolar disorder, unspecified: Secondary | ICD-10-CM

## 2022-05-19 DIAGNOSIS — N6459 Other signs and symptoms in breast: Secondary | ICD-10-CM

## 2022-05-19 DIAGNOSIS — Z9882 Breast implant status: Secondary | ICD-10-CM | POA: Diagnosis not present

## 2022-05-19 NOTE — Progress Notes (Signed)
Patient ID: Wanda Blanchard, female    DOB: November 02, 1975, 47 y.o.   MRN: [id number]   Chief Complaint  Patient presents with   Consult   Breast Problem    The patient is a 47 year old female here for evaluation of her breasts.  The patient had implants placed in 2009.  As she has 410 cc on the right and 350 cc on the left and saline implants.  They are [employer] implants in the picture of the card is in media.  She is 5 feet 8 inches tall and weighs 108 pounds.  She wears a D size cup now.  She has a history of mastitis when she was childbearing.  She is aware she has some asymmetry.  She is not diabetic but she is a smoker.  She had the implants placed in [place] by an OB/GYN name Wanda Blanchard.  She had some terrible left-sided breast pain in January.  She had a mammogram that showed an upper outer quadrant left breast tissue consistent with a lipoma.  She is thinking about either having the implants replaced or removed.  She is currently taking care of her dad which is very stressful.  Her sister is living with her.  She has a part-time job that is very flexible.  She wonders if her pain and discomfort is related to the implants and if maybe she should not have them put back in.  Her preference if she did have implants back in would be for saline implants.  She thinks they might be under the muscle.  The patient also states that they were put in while she was awake.    Review of Systems  Constitutional: Negative.   HENT: Negative.    Eyes: Negative.   Respiratory: Negative.  Negative for chest tightness and shortness of breath.   Cardiovascular: Negative.   Gastrointestinal: Negative.   Endocrine: Negative.   Genitourinary: Negative.   Musculoskeletal: Negative.     Past Medical History:  Diagnosis Date   Anxiety    Constipation    Depression    Fibromyalgia    Insomnia    Migraine    Myalgia and myositis    Ruptured ovarian cyst    Whole body pain     Past Surgical History:   Procedure Laterality Date   AUGMENTATION MAMMAPLASTY Bilateral    2009 Saline   CESAREAN SECTION     e sure     PLACEMENT OF BREAST IMPLANTS  2009      Current Outpatient Medications:    BIOTIN 5000 PO, Take 5,000 mg by mouth daily., Disp: , Rfl:    Cholecalciferol (VITAMIN D3) 5000 UNITS TABS, Take 5,000 Units by mouth daily., Disp: , Rfl:    clonazePAM (KLONOPIN) 0.5 MG tablet, TAKE 1 TABLET (0.5 MG TOTAL) BY MOUTH DAILY AS NEEDED., Disp: 30 tablet, Rfl: 2   lamoTRIgine (LAMICTAL) 100 MG tablet, Take 100 mg by mouth every morning., Disp: , Rfl:    Multiple Vitamins-Minerals (WOMENS MULTIVITAMIN PLUS PO), Take by mouth daily., Disp: , Rfl:    Norethindrone Acetate-Ethinyl Estrad-FE (HAILEY 24 FE) 1-20 MG-MCG(24) tablet, Take 1 tablet by mouth daily., Disp: 84 tablet, Rfl: 4   QUEtiapine (SEROQUEL XR) 50 MG TB24 24 hr tablet, Take 50 mg by mouth at bedtime., Disp: , Rfl:    SUMAtriptan (IMITREX) 50 MG tablet, Take 1 tablet (50 mg total) by mouth every 2 (two) hours as needed for migraine. May repeat in 2 hours  if headache persists or recurs., Disp: 12 tablet, Rfl: 6   traMADol (ULTRAM) 50 MG tablet, Take 0.5 tablets (25 mg total) by mouth daily as needed., Disp: 15 tablet, Rfl: 0  Current Facility-Administered Medications:    betamethasone acetate-betamethasone sodium phosphate (CELESTONE) injection 3 mg, 3 mg, Intramuscular, Once, Wanda Blanchard M, DPM   Objective:   Vitals:   05/19/22 1310  BP: 117/79  Pulse: 76  SpO2: 98%    Physical Exam Vitals and nursing note reviewed.  Constitutional:      Appearance: Normal appearance.  HENT:     Head: Normocephalic and atraumatic.  Cardiovascular:     Rate and Rhythm: Normal rate.     Pulses: Normal pulses.  Pulmonary:     Effort: Pulmonary effort is normal.  Abdominal:     Palpations: Abdomen is soft.  Skin:    General: Skin is warm.     Capillary Refill: Capillary refill takes less than 2 seconds.     Coloration: Skin is not  jaundiced.     Findings: No bruising.  Neurological:     Mental Status: She is alert and oriented to person, place, and time.  Psychiatric:        Mood and Affect: Mood normal.        Behavior: Behavior normal.     Assessment & Plan:  History of bilateral breast implants  Bipolar I disorder  Patient must be 3 months tobacco free prior to any elective surgery.  I also recommend she get in a good place of family help with her dad.  With her concerns of implant sickness it would be best to not put the implants back in.  Patient has requested a quote for removal of the implants with a mastopexy and replacement of the implants.  Pictures were obtained of the patient and placed in the chart with the patient's or guardian's permission.   Wanda Bills Jahlani Lorentz, DO

## 2022-06-02 ENCOUNTER — Encounter: Payer: Self-pay | Admitting: *Deleted

## 2022-06-26 ENCOUNTER — Encounter: Payer: Self-pay | Admitting: Nurse Practitioner

## 2022-07-03 NOTE — Patient Instructions (Signed)
Managing Anxiety, Adult After being diagnosed with anxiety, you may be relieved to know why you have felt or behaved a certain way. You may also feel overwhelmed about the treatment ahead and what it will mean for your life. With care and support, you can manage your anxiety. How to manage lifestyle changes Understanding the difference between stress and anxiety Although stress can play a role in anxiety, it is not the same as anxiety. Stress is your body's reaction to life changes and events, both good and bad. Stress is often caused by something external, such as a deadline, test, or competition. It normally goes away after the event has ended and will last just a few hours. But, stress can be ongoing and can lead to more than just stress. Anxiety is caused by something internal, such as imagining a terrible outcome or worrying that something will go wrong that will greatly upset you. Anxiety often does not go away even after the event is over, and it can become a long-term (chronic) worry. Lowering stress and anxiety Talk with your health care provider or a counselor to learn more about lowering anxiety and stress. They may suggest tension-reduction techniques, such as: Music. Spend time creating or listening to music that you enjoy and that inspires you. Mindfulness-based meditation. Practice being aware of your normal breaths while not trying to control your breathing. It can be done while sitting or walking. Centering prayer. Focus on a word, phrase, or sacred image that means something to you and brings you peace. Deep breathing. Expand your stomach and inhale slowly through your nose. Hold your breath for 3-5 seconds. Then breathe out slowly, letting your stomach muscles relax. Self-talk. Learn to notice and spot thought patterns that lead to anxiety reactions. Change those patterns to thoughts that feel peaceful. Muscle relaxation. Take time to tense muscles and then relax them. Choose a  tension-reduction technique that fits your lifestyle and personality. These techniques take time and practice. Set aside 5-15 minutes a day to do them. Specialized therapists can offer counseling and training in these techniques. The training to help with anxiety may be covered by some insurance plans. Other things you can do to manage stress and anxiety include: Keeping a stress diary. This can help you learn what triggers your reaction and then learn ways to manage your response. Thinking about how you react to certain situations. You may not be able to control everything, but you can control your response. Making time for activities that help you relax and not feeling guilty about spending your time in this way. Doing visual imagery. This involves imagining or creating mental pictures to help you relax. Practicing yoga. Through yoga poses, you can lower tension and relax.  Medicines Medicines for anxiety include: Antidepressant medicines. These are usually prescribed for long-term daily control. Anti-anxiety medicines. These may be added in severe cases, especially when panic attacks occur. When used together, medicines, psychotherapy, and tension-reduction techniques may be the most effective treatment. Relationships Relationships can play a big part in helping you recover. Spend more time connecting with trusted friends and family members. Think about going to couples counseling if you have a partner, taking family education classes, or going to family therapy. Therapy can help you and others better understand your anxiety. How to recognize changes in your anxiety Everyone responds differently to treatment for anxiety. Recovery from anxiety happens when symptoms lessen and stop interfering with your daily life at home or work. This may mean that you   will start to: Have better concentration and focus. Worry will interfere less in your daily thinking. Sleep better. Be less irritable. Have more  energy. Have improved memory. Try to recognize when your condition is getting worse. Contact your provider if your symptoms interfere with home or work and you feel like your condition is not improving. Follow these instructions at home: Activity Exercise. Adults should: Exercise for at least 150 minutes each week. The exercise should increase your heart rate and make you sweat (moderate-intensity exercise). Do strengthening exercises at least twice a week. Get the right amount and quality of sleep. Most adults need 7-9 hours of sleep each night. Lifestyle  Eat a healthy diet that includes plenty of vegetables, fruits, whole grains, low-fat dairy products, and lean protein. Do not eat a lot of foods that are high in fats, added sugars, or salt (sodium). Make choices that simplify your life. Do not use any products that contain nicotine or tobacco. These products include cigarettes, chewing tobacco, and vaping devices, such as e-cigarettes. If you need help quitting, ask your provider. Avoid caffeine, alcohol, and certain over-the-counter cold medicines. These may make you feel worse. Ask your pharmacist which medicines to avoid. General instructions Take over-the-counter and prescription medicines only as told by your provider. Keep all follow-up visits. This is to make sure you are managing your anxiety well or if you need more support. Where to find support You can get help and support from: Self-help groups. Online and community organizations. A trusted spiritual leader. Couples counseling. Family education classes. Family therapy. Where to find more information You may find that joining a support group helps you deal with your anxiety. The following sources can help you find counselors or support groups near you: Mental Health America: mentalhealthamerica.net Anxiety and Depression Association of America (ADAA): adaa.org National Alliance on Mental Illness (NAMI): nami.org Contact  a health care provider if: You have a hard time staying focused or finishing tasks. You spend many hours a day feeling worried about everyday life. You are very tired because you cannot stop worrying. You start to have headaches or often feel tense. You have chronic nausea or diarrhea. Get help right away if: Your heart feels like it is racing. You have shortness of breath. You have thoughts of hurting yourself or others. Get help right away if you feel like you may hurt yourself or others, or have thoughts about taking your own life. Go to your nearest emergency room or: Call 911. Call the National Suicide Prevention Lifeline at 1-800-273-8255 or 988. This is open 24 hours a day. Text the Crisis Text Line at 741741. This information is not intended to replace advice given to you by your health care provider. Make sure you discuss any questions you have with your health care provider. Document Revised: 11/01/2021 Document Reviewed: 05/16/2020 Elsevier Patient Education  2024 [employer]  

## 2022-07-05 ENCOUNTER — Ambulatory Visit: Payer: Medicaid Other | Admitting: Nurse Practitioner

## 2022-07-05 ENCOUNTER — Encounter: Payer: Self-pay | Admitting: Nurse Practitioner

## 2022-07-05 VITALS — BP 106/74 | HR 83 | Temp 98.5°F | Ht 67.99 in | Wt 109.6 lb

## 2022-07-05 DIAGNOSIS — F319 Bipolar disorder, unspecified: Secondary | ICD-10-CM

## 2022-07-05 DIAGNOSIS — R591 Generalized enlarged lymph nodes: Secondary | ICD-10-CM

## 2022-07-05 DIAGNOSIS — M797 Fibromyalgia: Secondary | ICD-10-CM

## 2022-07-05 DIAGNOSIS — F419 Anxiety disorder, unspecified: Secondary | ICD-10-CM

## 2022-07-05 NOTE — Progress Notes (Signed)
BP 106/74   Pulse 83   Temp 98.5 F (36.9 C) (Oral)   Ht 5' 7.99" (1.727 m)   Wt 109 lb 9.6 oz (49.7 kg)   SpO2 96%   BMI 16.67 kg/m    Subjective:    Patient ID: Wanda Blanchard, female    DOB: 1975/02/16, 47 y.o.   MRN: [id number]  HPI: Wanda Blanchard is a 47 y.o. female  Chief Complaint  Patient presents with   Mood   Fibromyalgia   FIBROMYALGIA Is followed by Dr. Yves Dill at [facility] orth[facility] at this time.  She was given injection to hip last on 11/09/21.  Does see massage therapy which offers benefit.  About 2 weeks ago started to have rib pain to right side after helping her boyfriend work on car and lying on ground.  Since then pain has improved and there is a little knot to area, she reports this is getting smaller.  Has had some illness during this time too with nasal drainage. Mammogram last on 02/08/22. Had tick bite around area May 1st. Pain status: stable Satisfied with current treatment?: yes Medication side effects: no Medication compliance: good compliance Duration: intermittent Location: varies Quality: aching Current pain level: mild Aggravating factors: none Alleviating factors: ice, heat, NSAIDs, and muscle relaxer Non-narcotic analgesic meds: yes Narcotic contract:yes Treatments attempted: ice, heat, and ibuprofen, Tramadol  ANXIETY/STRESS Followed by psychiatry and receives all medications through them. Duration:controlled Anxious mood: no  Excessive worrying: no Irritability: no  Sweating: no Nausea: no Palpitations: no Hyperventilation: no Panic attacks: no Agoraphobia: no  Obscessions/compulsions: no Depressed mood: no    07/05/2022    1:35 PM 02/03/2022    1:27 PM 01/04/2022   10:37 AM 09/21/2021    3:57 PM 01/03/2021    2:06 PM  Depression screen PHQ 2/9  Decreased Interest 2 1 1  0 1  Down, Depressed, Hopeless 1 1 1 1 1   PHQ - 2 Score 3 2 2 1 2   Altered sleeping 3 1 2 2 1   Tired, decreased energy 2 1 2 3 2   Change in appetite  3  3 3 3   Feeling bad or failure about yourself  0 0 0 0 1  Trouble concentrating 1 0 0 1 0  Moving slowly or fidgety/restless 0 0 0 0 0  Suicidal thoughts 0 0 0 0 0  PHQ-9 Score 9 7 9 10 9   Difficult doing work/chores Somewhat difficult Somewhat difficult Somewhat difficult Somewhat difficult        07/05/2022    1:35 PM 02/03/2022    1:27 PM 01/04/2022   10:38 AM 09/21/2021    3:58 PM  GAD 7 : Generalized Anxiety Score  Nervous, Anxious, on Edge 2 2 1 1   Control/stop worrying 0 0 0 1  Worry too much - different things 0 1 1 2   Trouble relaxing 1 1 0 2  Restless 0 0 0 1  Easily annoyed or irritable 2  1 0  Afraid - awful might happen 0 1 2 1   Total GAD 7 Score 5  5 8   Anxiety Difficulty Somewhat difficult Somewhat difficult Somewhat difficult Somewhat difficult   Relevant past medical, surgical, family and social history reviewed and updated as indicated. Interim medical history since our last visit reviewed. Allergies and medications reviewed and updated.  Review of Systems  Constitutional:  Negative for activity change, appetite change, diaphoresis, fatigue and fever.  Respiratory:  Negative for cough, chest tightness and shortness  of breath.   Cardiovascular:  Negative for chest pain, palpitations and leg swelling.  Gastrointestinal: Negative.   Neurological: Negative.   Psychiatric/Behavioral:  Negative for decreased concentration, self-injury, sleep disturbance and suicidal ideas. The patient is nervous/anxious.     Per HPI unless specifically indicated above     Objective:    BP 106/74   Pulse 83   Temp 98.5 F (36.9 C) (Oral)   Ht 5' 7.99" (1.727 m)   Wt 109 lb 9.6 oz (49.7 kg)   SpO2 96%   BMI 16.67 kg/m   Wt Readings from Last 3 Encounters:  07/05/22 109 lb 9.6 oz (49.7 kg)  05/19/22 108 lb 3.2 oz (49.1 kg)  02/03/22 107 lb 3.2 oz (48.6 kg)    Physical Exam Vitals and nursing note reviewed.  Constitutional:      General: She is awake. She is not in  acute distress.    Appearance: She is well-developed. She is not ill-appearing.  HENT:     Head: Normocephalic.     Right Ear: Hearing normal.     Left Ear: Hearing normal.     Nose: Nose normal.     Mouth/Throat:     Mouth: Mucous membranes are moist.  Eyes:     General: Lids are normal.        Right eye: No discharge.        Left eye: No discharge.     Conjunctiva/sclera: Conjunctivae normal.     Pupils: Pupils are equal, round, and reactive to light.  Neck:     Thyroid: No thyromegaly.     Vascular: No carotid bruit or JVD.  Cardiovascular:     Rate and Rhythm: Normal rate and regular rhythm.     Heart sounds: Normal heart sounds. No murmur heard.    No gallop.  Pulmonary:     Effort: Pulmonary effort is normal. No accessory muscle usage or respiratory distress.     Breath sounds: Normal breath sounds.  Abdominal:     General: Bowel sounds are normal. There is no distension.     Palpations: Abdomen is soft.     Tenderness: There is no abdominal tenderness.  Musculoskeletal:     Cervical back: Normal range of motion and neck supple.     Right lower leg: No edema.     Left lower leg: No edema.  Lymphadenopathy:     Head:     Right side of head: No submental, submandibular, tonsillar, preauricular or posterior auricular adenopathy.     Left side of head: No submental, submandibular, tonsillar, preauricular or posterior auricular adenopathy.     Cervical: No cervical adenopathy.     Upper Body:     Right upper body: Pectoral adenopathy present. No supraclavicular or axillary adenopathy.     Left upper body: No supraclavicular, axillary or pectoral adenopathy.     Comments: Small, firm lymph node noted to lateral right breast area.  Non tender.  Approx 12 cm underneath area healed tick bite present.  Skin:    General: Skin is warm and dry.  Neurological:     Mental Status: She is alert and oriented to person, place, and time.  Psychiatric:        Attention and Perception:  Attention normal.        Mood and Affect: Mood normal.        Behavior: Behavior normal. Behavior is cooperative.        Thought Content: Thought content normal.  Judgment: Judgment normal.    Results for orders placed or performed in visit on 01/04/22  CBC with Differential/Platelet  Result Value Ref Range   WBC 4.2 3.4 - 10.8 x10E3/uL   RBC 4.25 3.77 - 5.28 x10E6/uL   Hemoglobin 13.3 11.1 - 15.9 g/dL   Hematocrit 16.1 09.6 - 46.6 %   MCV 92 79 - 97 fL   MCH 31.3 26.6 - 33.0 pg   MCHC 34.0 31.5 - 35.7 g/dL   RDW 04.5 40.9 - 81.1 %   Platelets 247 150 - 450 x10E3/uL   Neutrophils 64 Not Estab. %   Lymphs 28 Not Estab. %   Monocytes 6 Not Estab. %   Eos 1 Not Estab. %   Basos 1 Not Estab. %   Neutrophils Absolute 2.7 1.4 - 7.0 x10E3/uL   Lymphocytes Absolute 1.2 0.7 - 3.1 x10E3/uL   Monocytes Absolute 0.3 0.1 - 0.9 x10E3/uL   EOS (ABSOLUTE) 0.1 0.0 - 0.4 x10E3/uL   Basophils Absolute 0.0 0.0 - 0.2 x10E3/uL   Immature Granulocytes 0 Not Estab. %   Immature Grans (Abs) 0.0 0.0 - 0.1 x10E3/uL  Comprehensive metabolic panel  Result Value Ref Range   Glucose 92 70 - 99 mg/dL   BUN 8 6 - 24 mg/dL   Creatinine, Ser 9.14 0.57 - 1.00 mg/dL   eGFR 87 >78 GN/FAO/1.30   BUN/Creatinine Ratio 9 9 - 23   Sodium 140 134 - 144 mmol/L   Potassium 3.8 3.5 - 5.2 mmol/L   Chloride 103 96 - 106 mmol/L   CO2 25 20 - 29 mmol/L   Calcium 9.0 8.7 - 10.2 mg/dL   Total Protein 6.0 6.0 - 8.5 g/dL   Albumin 4.1 3.9 - 4.9 g/dL   Globulin, Total 1.9 1.5 - 4.5 g/dL   Albumin/Globulin Ratio 2.2 1.2 - 2.2   Bilirubin Total 0.4 0.0 - 1.2 mg/dL   Alkaline Phosphatase 58 44 - 121 IU/L   AST 10 0 - 40 IU/L   ALT 9 0 - 32 IU/L  Lipid Panel w/o Chol/HDL Ratio  Result Value Ref Range   Cholesterol, Total 151 100 - 199 mg/dL   Triglycerides 865 0 - 149 mg/dL   HDL 57 >78 mg/dL   VLDL Cholesterol Cal 19 5 - 40 mg/dL   LDL Chol Calc (NIH) 75 0 - 99 mg/dL  TSH  Result Value Ref Range   TSH 2.300  0.450 - 4.500 uIU/mL  VITAMIN D 25 Hydroxy (Vit-D Deficiency, Fractures)  Result Value Ref Range   Vit D, 25-Hydroxy 68.9 30.0 - 100.0 ng/mL      Assessment & Plan:   Problem List Items Addressed This Visit       Immune and Lymphatic   Lymphadenopathy    To right lateral breast with no red flag signs.  Reports area is getting smaller -- recent tick bite near area + current URI symptoms.  Plan on return in 4 weeks for recheck.        Other   Anxiety    Refer to depression plan of care.      Bipolar I disorder (HCC) - Primary    Chronic, ongoing.  Denies SI/HI.  Continue current medication regimen, including Lamictal, plus continue collaboration with psychiatry.  Return in 6 months for follow-up.      Fibromyalgia    Chronic, ongoing.  Continue current medication regimen.  Continue collaboration visits with [facility].  Minimal Tramadol use, minimally uses at baseline.  Follow up plan: Return in about 4 weeks (around 08/02/2022) for Lymphadenopathy -- to right side breast.

## 2022-07-05 NOTE — Assessment & Plan Note (Signed)
Chronic, ongoing.  Denies SI/HI.  Continue current medication regimen, including Lamictal, plus continue collaboration with psychiatry.  Return in 6 months for follow-up. 

## 2022-07-05 NOTE — Assessment & Plan Note (Signed)
Chronic, ongoing.  Continue current medication regimen.  Continue collaboration visits with [facility].  Minimal Tramadol use, minimally uses at baseline. 

## 2022-07-05 NOTE — Assessment & Plan Note (Signed)
Refer to depression plan of care. 

## 2022-07-05 NOTE — Assessment & Plan Note (Signed)
To right lateral breast with no red flag signs.  Reports area is getting smaller -- recent tick bite near area + current URI symptoms.  Plan on return in 4 weeks for recheck.

## 2022-08-02 ENCOUNTER — Ambulatory Visit: Payer: Medicaid Other | Admitting: Nurse Practitioner

## 2022-08-29 IMAGING — MR MR LUMBAR SPINE W/O CM
4 of 6 series · 22 of 48 positions shown · non-contrast
Comparison: MRI lumbar spine dated February 05, 2020.

CLINICAL DATA: Chronic low back pain with bilateral leg pain and
weakness. No prior surgery.

EXAM:
MRI LUMBAR SPINE WITHOUT CONTRAST
TECHNIQUE: Multiplanar, multisequence MR imaging of the lumbar spine was
performed. No intravenous contrast was administered.

[Series 6: T2 · sagittal · 4.0mm · 0.73mm/px · 6 of 16 slices shown (1 of 3)]
[im 1/16]
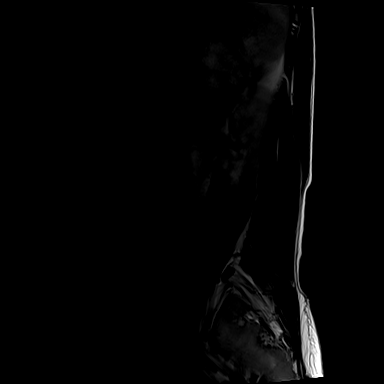
[im 4/16]
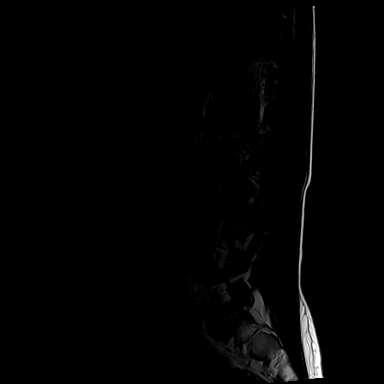
[im 7/16]
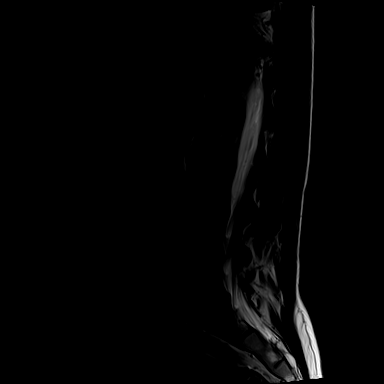
[im 10/16]
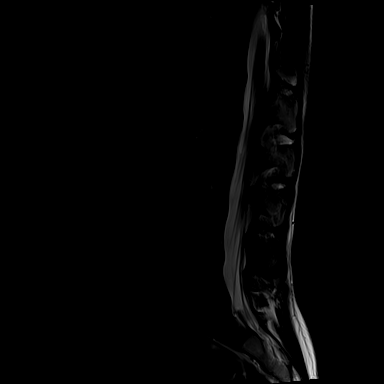
[im 13/16]
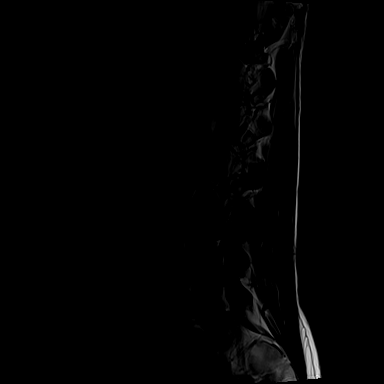
[im 16/16]
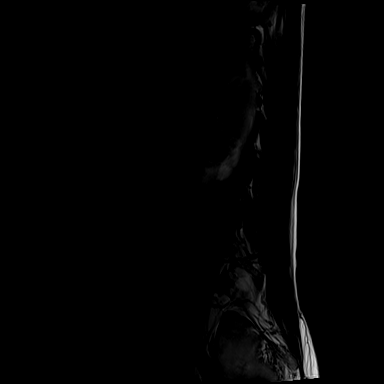

[Series 7: T1 · sagittal · 4.0mm · 0.73mm/px · 3 of 16 slices shown]
[im 4/16]
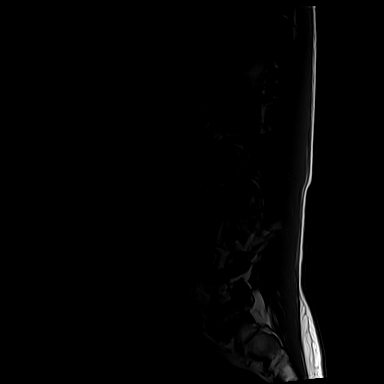
[im 10/16]
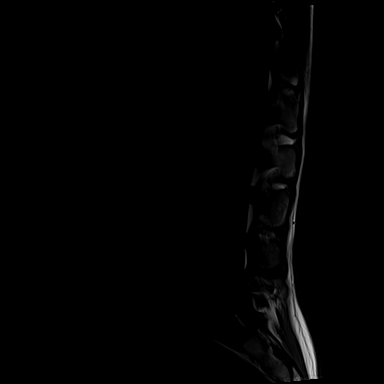
[im 16/16]
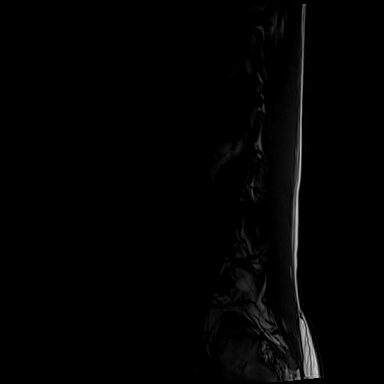

[Series 9: T2 · coronal · 5.0mm · 0.73mm/px · 5 of 16 slices shown (2 of 3)]
[im 1/16]
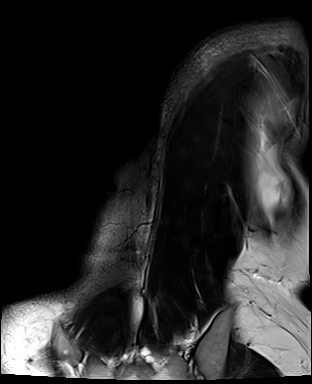
[im 4/16]
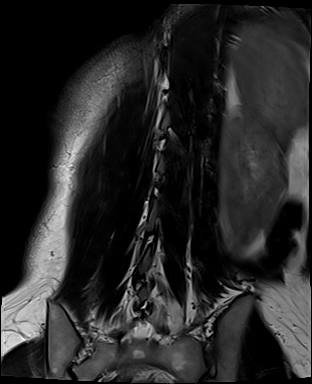
[im 8/16]
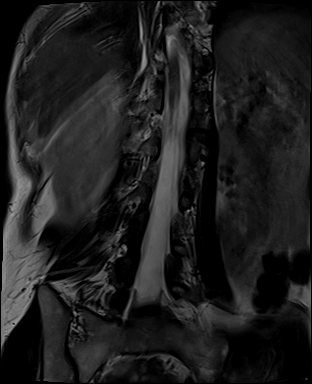
[im 12/16]
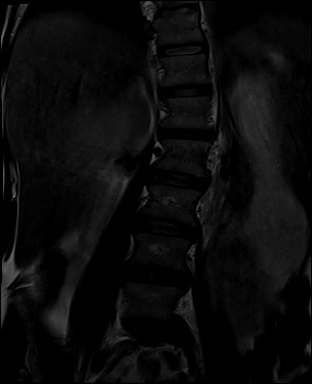
[im 16/16]
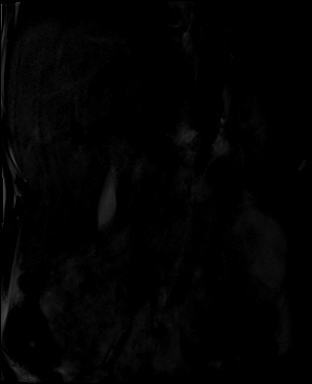

[Series 15: T2 · axial · 4.0mm · 0.28mm/px · z∈[-115,+58]mm · 8 of 38 slices shown (3 of 3)]
[im 1/38]
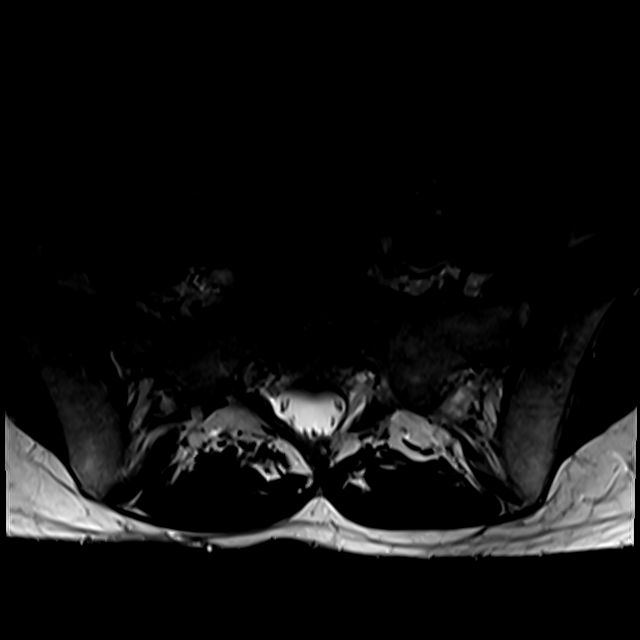
[im 7/38]
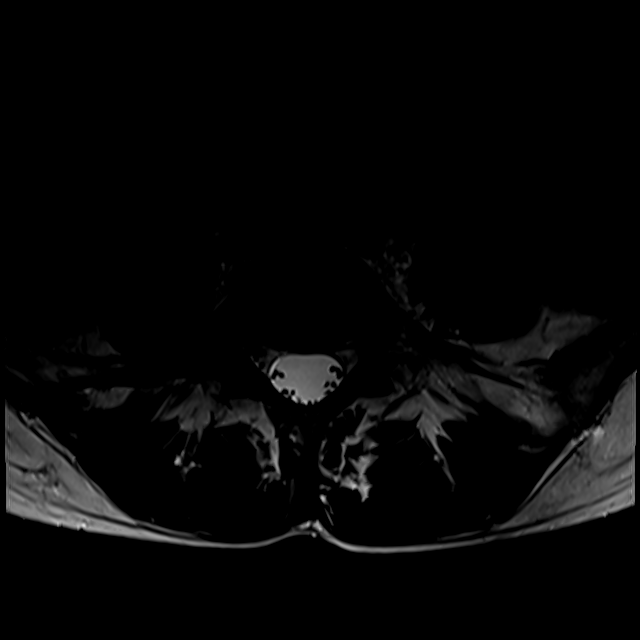
[im 13/38]
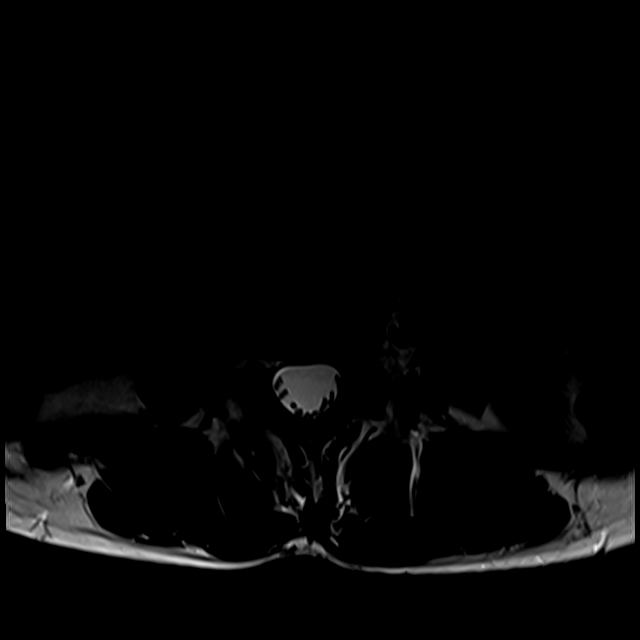
[im 16/38]
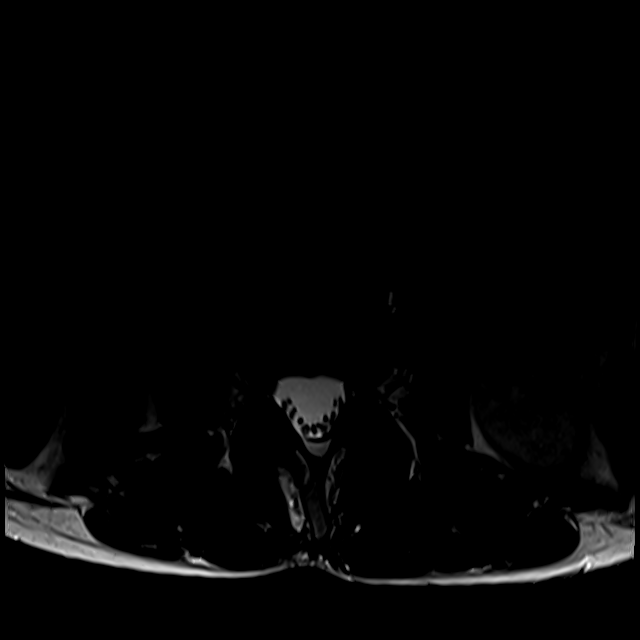
[im 19/38]
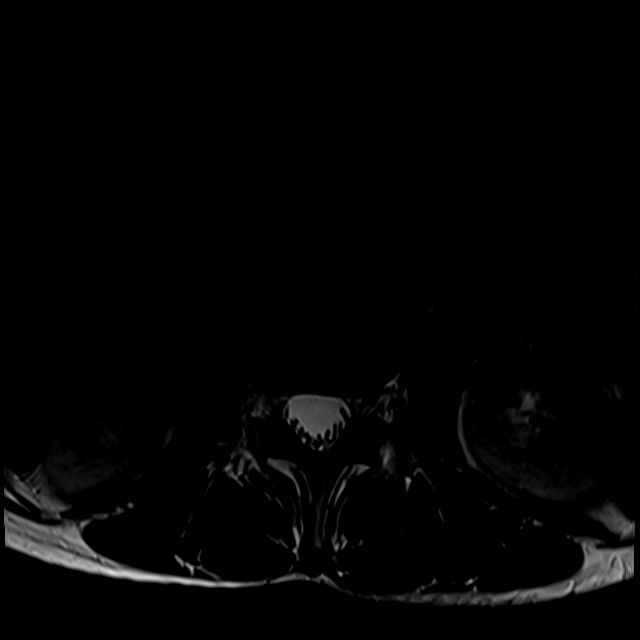
[im 22/38]
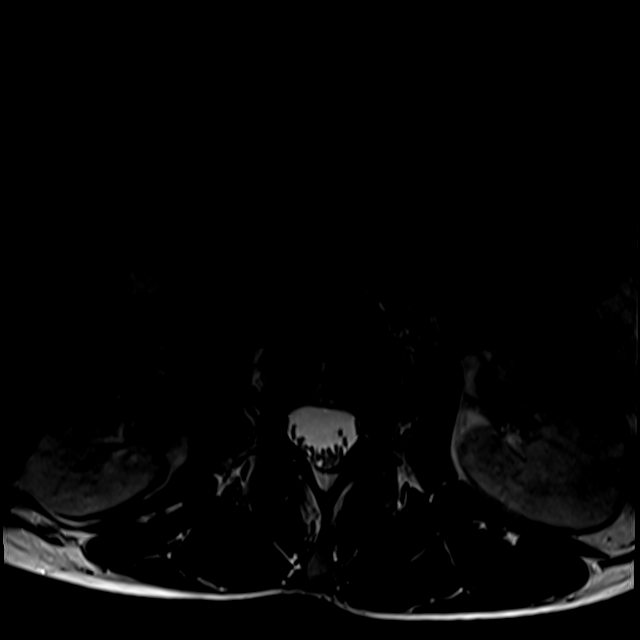
[im 25/38]
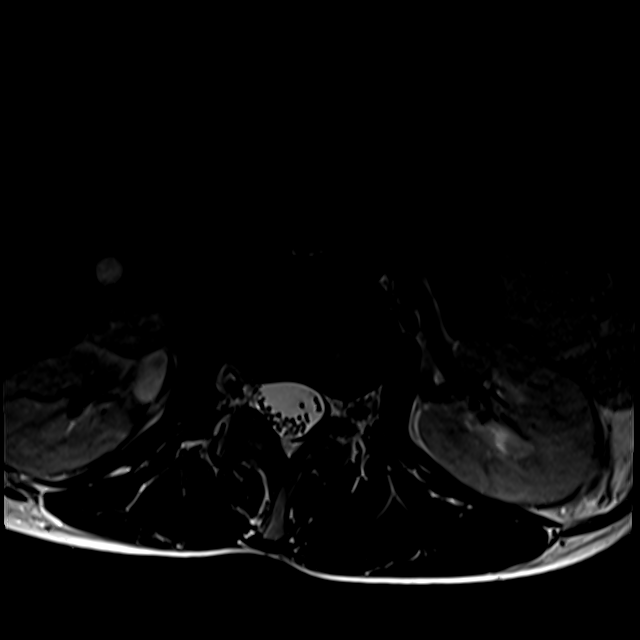
[im 31/38]
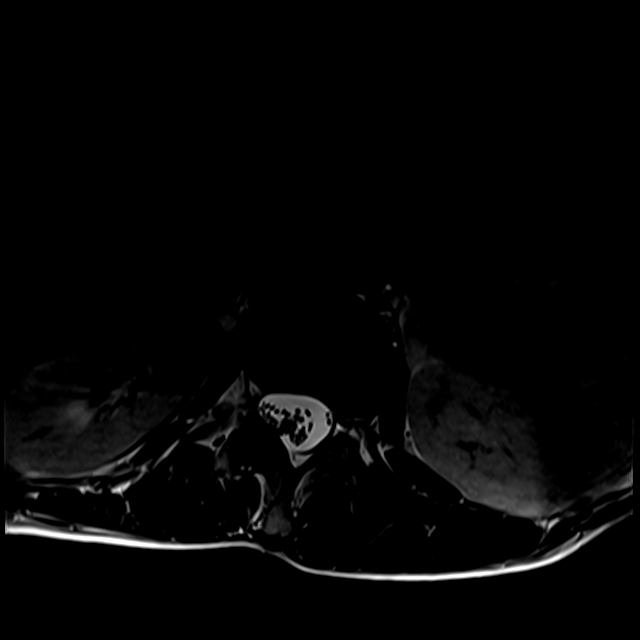

[22 of 48 positions shown; findings below may reference images not displayed]

FINDINGS: Segmentation:  Standard.

Alignment:  Unchanged levoscoliosis.  No listhesis.

Vertebrae:  No fracture, evidence of discitis, or bone lesion.

Conus medullaris and cauda equina: Conus extends to the L2 level.
Conus and cauda equina appear normal.

Paraspinal and other soft tissues: Negative.

Disc levels:

T12-L1:  Negative.

L1-L2: Unchanged mild right-sided disc desiccation. No disc bulge or
herniation. No stenosis.

L2-L3:  Negative.

L3-L4: Negative disc. Unchanged mild bilateral facet arthropathy. No
stenosis.

L4-L5: Unchanged mild disc bulging eccentric to the right. Unchanged
mild bilateral facet arthropathy. Unchanged mild right
neuroforaminal stenosis. No spinal canal or left neuroforaminal
stenosis.

L5-S1:  Negative disc.  Unchanged mild bilateral facet arthropathy.
IMPRESSION: 1. Unchanged mild lumbar spondylosis as described above, without
high-grade stenosis or impingement.

## 2022-10-04 DIAGNOSIS — M5412 Radiculopathy, cervical region: Secondary | ICD-10-CM | POA: Diagnosis not present

## 2022-10-04 DIAGNOSIS — M9901 Segmental and somatic dysfunction of cervical region: Secondary | ICD-10-CM | POA: Diagnosis not present

## 2022-10-04 DIAGNOSIS — M9905 Segmental and somatic dysfunction of pelvic region: Secondary | ICD-10-CM | POA: Diagnosis not present

## 2022-10-04 DIAGNOSIS — M5431 Sciatica, right side: Secondary | ICD-10-CM | POA: Diagnosis not present

## 2022-10-04 DIAGNOSIS — M9903 Segmental and somatic dysfunction of lumbar region: Secondary | ICD-10-CM | POA: Diagnosis not present

## 2022-10-04 DIAGNOSIS — M41117 Juvenile idiopathic scoliosis, lumbosacral region: Secondary | ICD-10-CM | POA: Diagnosis not present

## 2022-10-05 DIAGNOSIS — M5416 Radiculopathy, lumbar region: Secondary | ICD-10-CM | POA: Diagnosis not present

## 2022-10-05 DIAGNOSIS — M5136 Other intervertebral disc degeneration, lumbar region: Secondary | ICD-10-CM | POA: Diagnosis not present

## 2022-10-06 ENCOUNTER — Other Ambulatory Visit: Payer: Self-pay | Admitting: Family Medicine

## 2022-10-06 DIAGNOSIS — M5416 Radiculopathy, lumbar region: Secondary | ICD-10-CM

## 2022-10-17 ENCOUNTER — Other Ambulatory Visit: Payer: Self-pay | Admitting: Family Medicine

## 2022-10-17 DIAGNOSIS — M5416 Radiculopathy, lumbar region: Secondary | ICD-10-CM

## 2022-10-18 DIAGNOSIS — M9903 Segmental and somatic dysfunction of lumbar region: Secondary | ICD-10-CM | POA: Diagnosis not present

## 2022-10-18 DIAGNOSIS — M9901 Segmental and somatic dysfunction of cervical region: Secondary | ICD-10-CM | POA: Diagnosis not present

## 2022-10-18 DIAGNOSIS — M5431 Sciatica, right side: Secondary | ICD-10-CM | POA: Diagnosis not present

## 2022-10-18 DIAGNOSIS — M5412 Radiculopathy, cervical region: Secondary | ICD-10-CM | POA: Diagnosis not present

## 2022-10-18 DIAGNOSIS — M41117 Juvenile idiopathic scoliosis, lumbosacral region: Secondary | ICD-10-CM | POA: Diagnosis not present

## 2022-10-18 DIAGNOSIS — M9905 Segmental and somatic dysfunction of pelvic region: Secondary | ICD-10-CM | POA: Diagnosis not present

## 2022-10-20 ENCOUNTER — Other Ambulatory Visit: Payer: Self-pay | Admitting: Nurse Practitioner

## 2022-10-23 NOTE — Telephone Encounter (Signed)
Requested Prescriptions  Pending Prescriptions Disp Refills   SUMAtriptan (IMITREX) 50 MG tablet [Pharmacy Med Name: SUMATRIPTAN SUCC 50 MG TABLET] 9 tablet 6    Sig: TAKE 1 TAB EVERY 2 HOURS AS NEEDED FOR MIGRAINE-MAY REPEAT IN 2 HOURS IF HEADACHE PERSISTS OR RECURS     Neurology:  Migraine Therapy - Triptan Passed - 10/20/2022  3:35 PM      Passed - Last BP in normal range    BP Readings from Last 1 Encounters:  07/05/22 106/74         Passed - Valid encounter within last 12 months    Recent Outpatient Visits           3 months ago Bipolar I disorder (HCC)   [place] [facility] [place], Corrie Dandy T, NP   8 months ago Breast pain, left   [place] [facility] [place], Goldenrod T, NP   9 months ago Bipolar I disorder [facility])   [place] [facility] [place], Dorie Rank, NP   1 year ago Fibromyalgia   [place] [facility] [place], Corrie Dandy T, NP   1 year ago Bipolar I disorder [facility])    [facility] Marjie Skiff, NP

## 2022-11-03 ENCOUNTER — Ambulatory Visit
Admission: RE | Admit: 2022-11-03 | Discharge: 2022-11-03 | Disposition: A | Discharge status: Home / Self Care | Payer: Medicaid Other | Source: Ambulatory Visit | Attending: Family Medicine | Admitting: Family Medicine

## 2022-11-03 DIAGNOSIS — M5416 Radiculopathy, lumbar region: Secondary | ICD-10-CM

## 2022-11-15 DIAGNOSIS — M48062 Spinal stenosis, lumbar region with neurogenic claudication: Secondary | ICD-10-CM | POA: Diagnosis not present

## 2022-11-15 DIAGNOSIS — M5416 Radiculopathy, lumbar region: Secondary | ICD-10-CM | POA: Diagnosis not present

## 2022-11-22 DIAGNOSIS — M5431 Sciatica, right side: Secondary | ICD-10-CM | POA: Diagnosis not present

## 2022-11-22 DIAGNOSIS — M41117 Juvenile idiopathic scoliosis, lumbosacral region: Secondary | ICD-10-CM | POA: Diagnosis not present

## 2022-11-22 DIAGNOSIS — M9905 Segmental and somatic dysfunction of pelvic region: Secondary | ICD-10-CM | POA: Diagnosis not present

## 2022-11-22 DIAGNOSIS — M9901 Segmental and somatic dysfunction of cervical region: Secondary | ICD-10-CM | POA: Diagnosis not present

## 2022-11-22 DIAGNOSIS — M9903 Segmental and somatic dysfunction of lumbar region: Secondary | ICD-10-CM | POA: Diagnosis not present

## 2022-11-22 DIAGNOSIS — M5412 Radiculopathy, cervical region: Secondary | ICD-10-CM | POA: Diagnosis not present

## 2022-12-06 DIAGNOSIS — M5416 Radiculopathy, lumbar region: Secondary | ICD-10-CM | POA: Diagnosis not present

## 2022-12-06 DIAGNOSIS — M62830 Muscle spasm of back: Secondary | ICD-10-CM | POA: Diagnosis not present

## 2022-12-06 DIAGNOSIS — M48062 Spinal stenosis, lumbar region with neurogenic claudication: Secondary | ICD-10-CM | POA: Diagnosis not present

## 2022-12-29 DIAGNOSIS — M5431 Sciatica, right side: Secondary | ICD-10-CM | POA: Diagnosis not present

## 2022-12-29 DIAGNOSIS — M41117 Juvenile idiopathic scoliosis, lumbosacral region: Secondary | ICD-10-CM | POA: Diagnosis not present

## 2022-12-29 DIAGNOSIS — M9903 Segmental and somatic dysfunction of lumbar region: Secondary | ICD-10-CM | POA: Diagnosis not present

## 2022-12-29 DIAGNOSIS — M9901 Segmental and somatic dysfunction of cervical region: Secondary | ICD-10-CM | POA: Diagnosis not present

## 2022-12-29 DIAGNOSIS — M9905 Segmental and somatic dysfunction of pelvic region: Secondary | ICD-10-CM | POA: Diagnosis not present

## 2022-12-29 DIAGNOSIS — M5412 Radiculopathy, cervical region: Secondary | ICD-10-CM | POA: Diagnosis not present

## 2023-01-10 ENCOUNTER — Other Ambulatory Visit: Payer: Self-pay | Admitting: Nurse Practitioner

## 2023-01-10 ENCOUNTER — Encounter: Payer: Self-pay | Admitting: Nurse Practitioner

## 2023-01-11 MED ORDER — VALACYCLOVIR HCL 1 G PO TABS: 2000.0000 mg | ORAL_TABLET | Freq: Once | ORAL | 0 refills | Status: AC

## 2023-01-12 NOTE — Telephone Encounter (Signed)
Requested medication (s) are due for refill today: see below  Requested medication (s) are on the active medication list: no  Last refill:  Hailey 24 FE 01/04/22, Valtrex 2000mg  yesterday   Future visit scheduled: no  Notes to clinic:  Valtrex not on med list, but has ENDED, also Hailey FE was dc'd 07/05/22, please review for refill  valACYclovir (VALTREX) 1000 MG tablet 20 tablet 0 01/11/2023 01/11/2023   Sig - Route: Take 2 tablets (2,000 mg total) by mouth once for 1 dose. May repeat as needed for cold sores. - Oral        Requested Prescriptions  Pending Prescriptions Disp Refills   valACYclovir (VALTREX) 1000 MG tablet [Pharmacy Med Name: VALACYCLOVIR HCL 1 GRAM TABLET] 4 tablet 6    Sig: TAKE 2 TABLETS (2,000 MG TOTAL) BY MOUTH TWICE A DAY FOR 1 DAY     Antimicrobials:  Antiviral Agents - Anti-Herpetic Passed - 01/10/2023 11:53 AM      Passed - Valid encounter within last 12 months    Recent Outpatient Visits           6 months ago Bipolar I disorder (HCC)   [place] [facility] [place], Jolene T, NP   11 months ago Breast pain, left   [place] [facility] [place], [place] T, NP   1 year ago Bipolar I disorder (HCC)   [place] [facility] [place], Corrie Dandy T, NP   1 year ago Fibromyalgia   [place] [facility] [place], [place] T, NP   1 year ago Bipolar I disorder (HCC)   [place] [facility] [place], Jolene T, NP               HAILEY 24 FE 1-20 MG-MCG(24) tablet [Pharmacy Med Name: HAILEY 24 FE 1 MG-20 MCG TAB] 84 tablet 4    Sig: TAKE 1 TABLET BY MOUTH EVERY DAY     OB/GYN:  Contraceptives Passed - 01/10/2023 11:53 AM      Passed - Last BP in normal range    BP Readings from Last 1 Encounters:  07/05/22 106/74         Passed - Valid encounter within last 12 months    Recent Outpatient Visits           6 months ago Bipolar I disorder (HCC)   [place] [facility]  [place], [place] T, NP   11 months ago Breast pain, left   [place] [facility] [place], [place] T, NP   1 year ago Bipolar I disorder (HCC)   [place] [facility] [place], Dorie Rank, NP   1 year ago Fibromyalgia   [place] [facility] [place], Corrie Dandy T, NP   1 year ago Bipolar I disorder [facility])    [facility] [place], Dorie Rank, NP              Passed - Patient is not a smoker

## 2023-01-15 ENCOUNTER — Telehealth: Payer: Medicaid Other | Admitting: Physician Assistant

## 2023-01-15 DIAGNOSIS — J069 Acute upper respiratory infection, unspecified: Secondary | ICD-10-CM | POA: Diagnosis not present

## 2023-01-15 MED ORDER — FLUTICASONE PROPIONATE 50 MCG/ACT NA SUSP: 2.0000 | Freq: Every day | NASAL | 0 refills | Status: AC

## 2023-01-15 MED ORDER — BENZONATATE 100 MG PO CAPS: 100.0000 mg | ORAL_CAPSULE | Freq: Three times a day (TID) | ORAL | 0 refills | Status: DC | PRN

## 2023-01-15 NOTE — Progress Notes (Signed)
E visit for Flu like symptoms   We are sorry that you are not feeling well.  Here is how we plan to help! Based on what you have shared with me it looks like you may have flu-like symptoms that should be watched but do not seem to indicate anti-viral treatment.  Influenza or "the flu" is   an infection caused by a respiratory virus. The flu virus is highly contagious and persons who did not receive their yearly flu vaccination may "catch" the flu from close contact.  We have anti-viral medications to treat the viruses that cause this infection. They are not a "cure" and only shorten the course of the infection. These prescriptions are most effective when they are given within the first 2 days of "flu" symptoms.   Based upon your symptoms and potential risk factors I recommend that you follow the flu symptoms recommendation that I have listed below.   Symptoms vary from person to person, with common symptoms including sore throat, cough, fatigue or lack of energy and feeling of general discomfort.  A low-grade fever of up to 100.4 may present, but is often uncommon.  Symptoms vary however, and are closely related to a person's age or underlying illnesses.  The most common symptoms associated with an upper respiratory infection are nasal discharge or congestion, cough, sneezing, headache and pressure in the ears and face.  These symptoms usually persist for about 3 to 10 days, but can last up to 2 weeks.  It is important to know that upper respiratory infections do not cause serious illness or complications in most cases.    Upper respiratory infections can be transmitted from person to person, with the most common method of transmission being a person's hands.  The virus is able to live on the skin and can infect other persons for up to 2 hours after direct contact.  Also, these can be transmitted when someone coughs or sneezes; thus, it is important to cover the mouth to reduce this risk.  To keep the  spread of the illness at bay, good hand hygiene is very important.  This is an infection that is most likely caused by a virus. There are no specific treatments other than to help you with the symptoms until the infection runs its course.  We are sorry you are not feeling well.  Here is how we plan to help!  For nasal congestion, you may use an oral decongestants such as Mucinex D or if you have glaucoma or high blood pressure use plain Mucinex.  Saline nasal spray or nasal drops can help and can safely be used as often as needed for congestion.  For your congestion, I have prescribed Fluticasone nasal spray one spray in each nostril twice a day  If you do not have a history of heart disease, hypertension, diabetes or thyroid disease, prostate/bladder issues or glaucoma, you may also use Sudafed to treat nasal congestion.  It is highly recommended that you consult with a pharmacist or your primary care physician to ensure this medication is safe for you to take.     If you have a cough, you may use cough suppressants such as Delsym and Robitussin.  If you have glaucoma or high blood pressure, you can also use Coricidin HBP.   For cough I have prescribed for you A prescription cough medication called Tessalon Perles 100 mg. You may take 1-2 capsules every 8 hours as needed for cough  If you have a  sore or scratchy throat, use a saltwater gargle-  to  teaspoon of salt dissolved in a 4-ounce to 8-ounce glass of warm water.  Gargle the solution for approximately 15-30 seconds and then spit.  It is important not to swallow the solution.  You can also use throat lozenges/cough drops and Chloraseptic spray to help with throat pain or discomfort.  Warm or cold liquids can also be helpful in relieving throat pain.  For headache, pain or general discomfort, you can use Ibuprofen or Tylenol as directed.   Some authorities believe that zinc sprays or the use of Echinacea may shorten the course of your  symptoms.   ANYONE WHO HAS FLU SYMPTOMS SHOULD: Stay home. The flu is highly contagious and going out or to work exposes others! Be sure to drink plenty of fluids. Water is fine as well as fruit juices, sodas and electrolyte beverages. You may want to stay away from caffeine or alcohol. If you are nauseated, try taking small sips of liquids. How do you know if you are getting enough fluid? Your urine should be a pale yellow or almost colorless. Get rest. Taking a steamy shower or using a humidifier may help nasal congestion and ease sore throat pain. Using a saline nasal spray works much the same way. Cough drops, hard candies and sore throat lozenges may ease your cough. Line up a caregiver. Have someone check on you regularly.   GET HELP RIGHT AWAY IF: You cannot keep down liquids or your medications. You become short of breath Your fell like you are going to pass out or loose consciousness. Your symptoms persist after you have completed your treatment plan MAKE SURE YOU  Understand these instructions. Will watch your condition. Will get help right away if you are not doing well or get worse.  Your e-visit answers were reviewed by a board certified advanced clinical practitioner to complete your personal care plan.  Depending on the condition, your plan could have included both over the counter or prescription medications.  If there is a problem please reply  once you have received a response from your provider.  Your safety is important to [place].  If you have drug allergies check your prescription carefully.    You can use MyChart to ask questions about today's visit, request a non-urgent call back, or ask for a work or school excuse for 24 hours related to this e-Visit. If it has been greater than 24 hours you will need to follow up with your provider, or enter a new e-Visit to address those concerns.  You will get an e-mail in the next two days asking about your experience.  I hope  that your e-visit has been valuable and will speed your recovery. Thank you for using e-visits.   I have spent 5 minutes in review of e-visit questionnaire, review and updating patient chart, medical decision making and response to patient.   Margaretann Loveless, PA-C

## 2023-03-11 NOTE — Patient Instructions (Incomplete)

## 2023-03-14 ENCOUNTER — Ambulatory Visit: Payer: Medicaid Other | Admitting: Nurse Practitioner

## 2023-03-22 DIAGNOSIS — F319 Bipolar disorder, unspecified: Secondary | ICD-10-CM | POA: Diagnosis not present

## 2023-07-16 DIAGNOSIS — M5417 Radiculopathy, lumbosacral region: Secondary | ICD-10-CM | POA: Diagnosis not present

## 2023-07-16 DIAGNOSIS — M9903 Segmental and somatic dysfunction of lumbar region: Secondary | ICD-10-CM | POA: Diagnosis not present

## 2023-07-16 DIAGNOSIS — M9902 Segmental and somatic dysfunction of thoracic region: Secondary | ICD-10-CM | POA: Diagnosis not present

## 2023-07-16 DIAGNOSIS — G43001 Migraine without aura, not intractable, with status migrainosus: Secondary | ICD-10-CM | POA: Diagnosis not present

## 2023-07-16 DIAGNOSIS — M5416 Radiculopathy, lumbar region: Secondary | ICD-10-CM | POA: Diagnosis not present

## 2023-07-16 DIAGNOSIS — M9905 Segmental and somatic dysfunction of pelvic region: Secondary | ICD-10-CM | POA: Diagnosis not present

## 2023-07-16 DIAGNOSIS — M9901 Segmental and somatic dysfunction of cervical region: Secondary | ICD-10-CM | POA: Diagnosis not present

## 2023-07-16 DIAGNOSIS — M62830 Muscle spasm of back: Secondary | ICD-10-CM | POA: Diagnosis not present

## 2023-07-19 DIAGNOSIS — M9903 Segmental and somatic dysfunction of lumbar region: Secondary | ICD-10-CM | POA: Diagnosis not present

## 2023-07-19 DIAGNOSIS — M5416 Radiculopathy, lumbar region: Secondary | ICD-10-CM | POA: Diagnosis not present

## 2023-07-19 DIAGNOSIS — M9905 Segmental and somatic dysfunction of pelvic region: Secondary | ICD-10-CM | POA: Diagnosis not present

## 2023-07-19 DIAGNOSIS — G43001 Migraine without aura, not intractable, with status migrainosus: Secondary | ICD-10-CM | POA: Diagnosis not present

## 2023-07-19 DIAGNOSIS — M5417 Radiculopathy, lumbosacral region: Secondary | ICD-10-CM | POA: Diagnosis not present

## 2023-07-19 DIAGNOSIS — M62830 Muscle spasm of back: Secondary | ICD-10-CM | POA: Diagnosis not present

## 2023-07-19 DIAGNOSIS — M9902 Segmental and somatic dysfunction of thoracic region: Secondary | ICD-10-CM | POA: Diagnosis not present

## 2023-07-19 DIAGNOSIS — M9901 Segmental and somatic dysfunction of cervical region: Secondary | ICD-10-CM | POA: Diagnosis not present

## 2023-07-26 DIAGNOSIS — G43001 Migraine without aura, not intractable, with status migrainosus: Secondary | ICD-10-CM | POA: Diagnosis not present

## 2023-07-26 DIAGNOSIS — M9903 Segmental and somatic dysfunction of lumbar region: Secondary | ICD-10-CM | POA: Diagnosis not present

## 2023-07-26 DIAGNOSIS — M5417 Radiculopathy, lumbosacral region: Secondary | ICD-10-CM | POA: Diagnosis not present

## 2023-07-26 DIAGNOSIS — M9902 Segmental and somatic dysfunction of thoracic region: Secondary | ICD-10-CM | POA: Diagnosis not present

## 2023-07-26 DIAGNOSIS — M9905 Segmental and somatic dysfunction of pelvic region: Secondary | ICD-10-CM | POA: Diagnosis not present

## 2023-07-26 DIAGNOSIS — M5416 Radiculopathy, lumbar region: Secondary | ICD-10-CM | POA: Diagnosis not present

## 2023-07-26 DIAGNOSIS — M62830 Muscle spasm of back: Secondary | ICD-10-CM | POA: Diagnosis not present

## 2023-07-26 DIAGNOSIS — M9901 Segmental and somatic dysfunction of cervical region: Secondary | ICD-10-CM | POA: Diagnosis not present

## 2023-08-01 DIAGNOSIS — M9903 Segmental and somatic dysfunction of lumbar region: Secondary | ICD-10-CM | POA: Diagnosis not present

## 2023-08-01 DIAGNOSIS — M9901 Segmental and somatic dysfunction of cervical region: Secondary | ICD-10-CM | POA: Diagnosis not present

## 2023-08-01 DIAGNOSIS — M9905 Segmental and somatic dysfunction of pelvic region: Secondary | ICD-10-CM | POA: Diagnosis not present

## 2023-08-01 DIAGNOSIS — G43001 Migraine without aura, not intractable, with status migrainosus: Secondary | ICD-10-CM | POA: Diagnosis not present

## 2023-08-01 DIAGNOSIS — M5413 Radiculopathy, cervicothoracic region: Secondary | ICD-10-CM | POA: Diagnosis not present

## 2023-08-01 DIAGNOSIS — M62830 Muscle spasm of back: Secondary | ICD-10-CM | POA: Diagnosis not present

## 2023-08-01 DIAGNOSIS — M5416 Radiculopathy, lumbar region: Secondary | ICD-10-CM | POA: Diagnosis not present

## 2023-08-01 DIAGNOSIS — M9902 Segmental and somatic dysfunction of thoracic region: Secondary | ICD-10-CM | POA: Diagnosis not present

## 2023-08-30 DIAGNOSIS — Z79899 Other long term (current) drug therapy: Secondary | ICD-10-CM | POA: Diagnosis not present

## 2023-08-30 DIAGNOSIS — F319 Bipolar disorder, unspecified: Secondary | ICD-10-CM | POA: Diagnosis not present

## 2023-08-31 DIAGNOSIS — Z79899 Other long term (current) drug therapy: Secondary | ICD-10-CM | POA: Diagnosis not present

## 2023-10-01 DIAGNOSIS — M5416 Radiculopathy, lumbar region: Secondary | ICD-10-CM | POA: Diagnosis not present

## 2023-10-01 DIAGNOSIS — M5417 Radiculopathy, lumbosacral region: Secondary | ICD-10-CM | POA: Diagnosis not present

## 2023-10-01 DIAGNOSIS — M9901 Segmental and somatic dysfunction of cervical region: Secondary | ICD-10-CM | POA: Diagnosis not present

## 2023-10-01 DIAGNOSIS — M9902 Segmental and somatic dysfunction of thoracic region: Secondary | ICD-10-CM | POA: Diagnosis not present

## 2023-10-01 DIAGNOSIS — G43001 Migraine without aura, not intractable, with status migrainosus: Secondary | ICD-10-CM | POA: Diagnosis not present

## 2023-10-01 DIAGNOSIS — M9903 Segmental and somatic dysfunction of lumbar region: Secondary | ICD-10-CM | POA: Diagnosis not present

## 2023-10-01 DIAGNOSIS — M9905 Segmental and somatic dysfunction of pelvic region: Secondary | ICD-10-CM | POA: Diagnosis not present

## 2023-10-01 DIAGNOSIS — M62830 Muscle spasm of back: Secondary | ICD-10-CM | POA: Diagnosis not present

## 2023-10-25 ENCOUNTER — Ambulatory Visit: Admitting: Nurse Practitioner

## 2023-11-05 NOTE — Patient Instructions (Incomplete)
 Be Involved in Caring For Your Health:  Taking Medications When medications are taken as directed, they can greatly improve your health. But if they are not taken as prescribed, they may not work. In some cases, not taking them correctly can be harmful. To help ensure your treatment remains effective and safe, understand your medications and how to take them. Bring your medications to each visit for review by your provider.  Your lab results, notes, and after visit summary will be available on My Chart. We strongly encourage you to use this feature. If lab results are abnormal the clinic will contact you with the appropriate steps. If the clinic does not contact you assume the results are satisfactory. You can always view your results on My Chart. If you have questions regarding your health or results, please contact the clinic during office hours. You can also ask questions on My Chart.  We at [facility] are grateful that you chose us  to provide your care. We strive to provide evidence-based and compassionate care and are always looking for feedback. If you get a survey from the clinic please complete this so we can hear your opinions.  Healthy Eating, Adult Healthy eating may help you get and keep a healthy body weight, reduce the risk of chronic disease, and live a long and productive life. It is important to follow a healthy eating pattern. Your nutritional and calorie needs should be met mainly by different nutrient-rich foods. What are tips for following this plan? Reading food labels Read labels and choose the following: Reduced or low sodium products. Juices with 100% fruit juice. Foods with low saturated fats (<3 g per serving) and high polyunsaturated and monounsaturated fats. Foods with whole grains, such as whole wheat, cracked wheat, brown rice, and wild rice. Whole grains that are fortified with folic acid. This is recommended for females who are pregnant or who want to  become pregnant. Read labels and do not eat or drink the following: Foods or drinks with added sugars. These include foods that contain brown sugar, corn sweetener, corn syrup, dextrose , fructose, glucose, high-fructose corn syrup, honey, invert sugar, lactose, malt syrup, maltose, molasses, raw sugar, sucrose, trehalose, or turbinado sugar. Limit your intake of added sugars to less than 10% of your total daily calories. Do not eat more than the following amounts of added sugar per day: 6 teaspoons (25 g) for females. 9 teaspoons (38 g) for males. Foods that contain processed or refined starches and grains. Refined grain products, such as white flour, degermed cornmeal, white bread, and white rice. Shopping Choose nutrient-rich snacks, such as vegetables, whole fruits, and nuts. Avoid high-calorie and high-sugar snacks, such as potato chips, fruit snacks, and candy. Use oil-based dressings and spreads on foods instead of solid fats such as butter, margarine, sour cream, or cream cheese. Limit pre-made sauces, mixes, and instant products such as flavored rice, instant noodles, and ready-made pasta. Try more plant-protein sources, such as tofu, tempeh, black beans, edamame, lentils, nuts, and seeds. Explore eating plans such as the Mediterranean diet or vegetarian diet. Try heart-healthy dips made with beans and healthy fats like hummus and guacamole. Vegetables go great with these. Cooking Use oil to saut or stir-fry foods instead of solid fats such as butter, margarine, or lard. Try baking, boiling, grilling, or broiling instead of frying. Remove the fatty part of meats before cooking. Steam vegetables in water  or broth. Meal planning  At meals, imagine dividing your plate into fourths: One-half of  your plate is fruits and vegetables. One-fourth of your plate is whole grains. One-fourth of your plate is protein, especially lean meats, poultry, eggs, tofu, beans, or nuts. Include low-fat  dairy as part of your daily diet. Lifestyle Choose healthy options in all settings, including home, work, school, restaurants, or stores. Prepare your food safely: Wash your hands after handling raw meats. Where you prepare food, keep surfaces clean by regularly washing with hot, soapy water . Keep raw meats separate from ready-to-eat foods, such as fruits and vegetables. Cook seafood, meat, poultry, and eggs to the recommended temperature. Get a food thermometer. Store foods at safe temperatures. In general: Keep cold foods at 84F (4.4C) or below. Keep hot foods at 184F (60C) or above. Keep your freezer at [facility] (-17.8C) or below. Foods are not safe to eat if they have been between the temperatures of 40-184F (4.4-60C) for more than 2 hours. What foods should I eat? Fruits Aim to eat 1-2 cups of fresh, canned (in natural juice), or frozen fruits each day. One cup of fruit equals 1 small apple, 1 large banana, 8 large strawberries, 1 cup (237 g) canned fruit,  cup (82 g) dried fruit, or 1 cup (240 mL) 100% juice. Vegetables Aim to eat 2-4 cups of fresh and frozen vegetables each day, including different varieties and colors. One cup of vegetables equals 1 cup (91 g) broccoli or cauliflower florets, 2 medium carrots, 2 cups (150 g) raw, leafy greens, 1 large tomato, 1 large bell pepper, 1 large sweet potato, or 1 medium white potato. Grains Aim to eat 5-10 ounce-equivalents of whole grains each day. Examples of 1 ounce-equivalent of grains include 1 slice of bread, 1 cup (40 g) ready-to-eat cereal, 3 cups (24 g) popcorn, or  cup (93 g) cooked rice. Meats and other proteins Try to eat 5-7 ounce-equivalents of protein each day. Examples of 1 ounce-equivalent of protein include 1 egg,  oz nuts (12 almonds, 24 pistachios, or 7 walnut halves), 1/4 cup (90 g) cooked beans, 6 tablespoons (90 g) hummus or 1 tablespoon (16 g) peanut butter. A cut of meat or fish that is the size of a deck of  cards is about 3-4 ounce-equivalents (85 g). Of the protein you eat each week, try to have at least 8 sounce (227 g) of seafood. This is about 2 servings per week. This includes salmon, trout, herring, sardines, and anchovies. Dairy Aim to eat 3 cup-equivalents of fat-free or low-fat dairy each day. Examples of 1 cup-equivalent of dairy include 1 cup (240 mL) milk, 8 ounces (250 g) yogurt, 1 ounces (44 g) natural cheese, or 1 cup (240 mL) fortified soy milk. Fats and oils Aim for about 5 teaspoons (21 g) of fats and oils per day. Choose monounsaturated fats, such as canola and olive oils, mayonnaise made with olive oil or avocado oil, avocados, peanut butter, and most nuts, or polyunsaturated fats, such as sunflower, corn, and soybean oils, walnuts, pine nuts, sesame seeds, sunflower seeds, and flaxseed. Beverages Aim for 6 eight-ounce glasses of water  per day. Limit coffee to 3-5 eight-ounce cups per day. Limit caffeinated beverages that have added calories, such as soda and energy drinks. If you drink alcohol: Limit how much you have to: 0-1 drink a day if you are female. 0-2 drinks a day if you are female. Know how much alcohol is in your drink. In the [place] one drink is one 12 oz bottle of beer (355 mL), one 5 oz glass of wine (  148 mL), or one 1 oz glass of hard liquor (44 mL). Seasoning and other foods Try not to add too much salt to your food. Try using herbs and spices instead of salt. Try not to add sugar to food. This information is based on [place] nutrition guidelines. To learn more, visit [employer]. Exact amounts may vary. You may need different amounts. This information is not intended to replace advice given to you by your health care provider. Make sure you discuss any questions you have with your health care provider. Document Revised: 10/24/2021 Document Reviewed: 10/24/2021 Elsevier Patient Education  2024 [employer].

## 2023-11-08 ENCOUNTER — Ambulatory Visit: Admitting: Nurse Practitioner

## 2023-11-08 ENCOUNTER — Encounter: Payer: Self-pay | Admitting: Nurse Practitioner

## 2023-11-08 VITALS — BP 100/69 | HR 70 | Temp 98.0°F | Ht 67.0 in | Wt 113.2 lb

## 2023-11-08 DIAGNOSIS — M797 Fibromyalgia: Secondary | ICD-10-CM

## 2023-11-08 DIAGNOSIS — Z1231 Encounter for screening mammogram for malignant neoplasm of breast: Secondary | ICD-10-CM

## 2023-11-08 DIAGNOSIS — Z13220 Encounter for screening for lipoid disorders: Secondary | ICD-10-CM

## 2023-11-08 DIAGNOSIS — G43109 Migraine with aura, not intractable, without status migrainosus: Secondary | ICD-10-CM

## 2023-11-08 DIAGNOSIS — F419 Anxiety disorder, unspecified: Secondary | ICD-10-CM | POA: Diagnosis not present

## 2023-11-08 DIAGNOSIS — F319 Bipolar disorder, unspecified: Secondary | ICD-10-CM | POA: Diagnosis not present

## 2023-11-08 DIAGNOSIS — E559 Vitamin D deficiency, unspecified: Secondary | ICD-10-CM

## 2023-11-08 DIAGNOSIS — Z136 Encounter for screening for cardiovascular disorders: Secondary | ICD-10-CM

## 2023-11-08 DIAGNOSIS — Z0000 Encounter for general adult medical examination without abnormal findings: Secondary | ICD-10-CM

## 2023-11-08 MED ORDER — CYCLOBENZAPRINE HCL 10 MG PO TABS: 10.0000 mg | ORAL_TABLET | Freq: Three times a day (TID) | ORAL | 2 refills | Status: AC | PRN

## 2023-11-08 NOTE — Assessment & Plan Note (Signed)
 Chronic, ongoing.  Continue current medication regimen.  Continue collaboration with ortho and physiatry as needed.  Minimal Tramadol  use, reports no need for refill today.  Will send in Flexeril  refills.

## 2023-11-08 NOTE — Assessment & Plan Note (Signed)
 Refer to depression plan of care.

## 2023-11-08 NOTE — Assessment & Plan Note (Signed)
Chronic, stable.  Minimal episodes.  Imitrex refills up to date.

## 2023-11-08 NOTE — Progress Notes (Signed)
 BP 100/69   Pulse 70   Temp 98 F (36.7 C) (Oral)   Ht 5' 7 (1.702 m)   Wt 113 lb 3.2 oz (51.3 kg)   LMP  (LMP Unknown)   SpO2 100%   BMI 17.73 kg/m    Subjective:    Patient ID: Wanda Blanchard, female    DOB: [birth year], 48 y.o.   MRN: [id number]  HPI: Wanda Blanchard is a 48 y.o. female presenting on 11/08/2023 for comprehensive medical examination. Current medical complaints include:none  She currently lives with: spouse Menopausal Symptoms: no  Continues to take Imitrex  for migraines. Less headaches recently and less anxiety.  FIBROMYALGIA Followed with ortho and physiatry in past, last saw 12/06/22. Has been having ongoing right hip pain.  Massage therapist works on this. This pain radiates into hip and lower back. Once is massaged out it feels better, but this is short lived. 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  Pain 6-7/10 today. Takes Vitamin D  at home. Pain status: stable Satisfied with current treatment?: yes Medication side effects: no Medication compliance: good compliance Duration: chronic Location: as above Quality: aching, sharp Current pain level: 6-7/10 Aggravating factors: none Alleviating factors: ice, heat, NSAIDs, massage, and muscle relaxer Non-narcotic analgesic meds: yes Narcotic contract:yes Treatments attempted: ice, heat, and ibuprofen, Tramadol   ANXIETY/STRESS Follows with psychiatry, last visit. Taking Seroquel and Vistaril. Duration: controlled Anxious mood: no  Excessive worrying: no Irritability: no  Sweating: no Nausea: no Palpitations: no Hyperventilation: no Panic attacks: no Agoraphobia: no  Obscessions/compulsions: no Depressed mood: no    11/08/2023   10:40 AM 07/05/2022    1:35 PM 02/03/2022    1:27 PM 01/04/2022   10:37 AM 09/21/2021    3:57 PM  Depression screen PHQ 2/9  Decreased Interest 0 2 1 1  0  Down, Depressed, Hopeless 0 1 1 1 1   PHQ - 2 Score 0 3 2  2 1   Altered sleeping 2 3 1 2 2   Tired, decreased energy 1 2 1 2 3   Change in appetite 0  3 3 3   Feeling bad or failure about yourself  0 0 0 0 0  Trouble concentrating 0 1 0 0 1  Moving slowly or fidgety/restless 0 0 0 0 0  Suicidal thoughts 0 0 0 0 0  PHQ-9 Score 3 9 7 9 10   Difficult doing work/chores Somewhat difficult Somewhat difficult Somewhat difficult Somewhat difficult Somewhat difficult       11/08/2023   10:40 AM 07/05/2022    1:35 PM 02/03/2022    1:27 PM 01/04/2022   10:38 AM  GAD 7 : Generalized Anxiety Score  Nervous, Anxious, on Edge 0 2 2 1   Control/stop worrying 0 0 0 0  Worry too much - different things 1 0 1 1  Trouble relaxing 1 1 1  0  Restless 0 0 0 0  Easily annoyed or irritable 1 2  1   Afraid - awful might happen 0 0 1 2  Total GAD 7 Score 3 5  5   Anxiety Difficulty Not difficult at all Somewhat difficult Somewhat difficult Somewhat difficult      01/04/2022   10:37 AM 02/03/2022    1:23 PM 02/03/2022    1:26 PM 07/05/2022    1:35 PM 11/08/2023   10:40 AM  Fall Risk  Falls in the past year? 0 0 0 0 0  Was there an injury with  Fall? 0 0 0 0 0  Fall Risk Category Calculator 0 0 0 0 0  Fall Risk Category (Retired) Low  Low  Low     Patient at Risk for Falls Due to No Fall Risks No Fall Risks No Fall Risks No Fall Risks No Fall Risks  Fall risk Follow up Falls evaluation completed  Falls evaluation completed  Falls evaluation completed  Falls evaluation completed Falls evaluation completed     Data saved with a previous flowsheet row definition    Past Medical History:  Past Medical History:  Diagnosis Date   Anxiety    Constipation    Depression    Fibromyalgia    Insomnia    Migraine    Myalgia and myositis    Ruptured ovarian cyst    Whole body pain    Surgical History:  Past Surgical History:  Procedure Laterality Date   AUGMENTATION MAMMAPLASTY Bilateral    2009 Saline   CESAREAN SECTION     e sure     PLACEMENT OF BREAST IMPLANTS   2009    Medications:  Current Outpatient Medications on File Prior to Visit  Medication Sig   BIOTIN 5000 PO Take 5,000 mg by mouth daily.   Cholecalciferol (VITAMIN D3) 5000 UNITS TABS Take 5,000 Units by mouth daily.   fluticasone  (FLONASE ) 50 MCG/ACT nasal spray Place 2 sprays into both nostrils daily.   hydrOXYzine (VISTARIL) 25 MG capsule Take 25 mg by mouth 2 (two) times daily as needed.   Multiple Vitamins-Minerals (WOMENS MULTIVITAMIN PLUS PO) Take by mouth daily.   QUEtiapine (SEROQUEL XR) 50 MG TB24 24 hr tablet Take 50 mg by mouth at bedtime.   SUMAtriptan  (IMITREX ) 50 MG tablet TAKE 1 TAB EVERY 2 HOURS AS NEEDED FOR MIGRAINE-MAY REPEAT IN 2 HOURS IF HEADACHE PERSISTS OR RECURS   traMADol  (ULTRAM ) 50 MG tablet Take 0.5 tablets (25 mg total) by mouth daily as needed.   valACYclovir  (VALTREX ) 1000 MG tablet TAKE 2 TABLETS (2,000 MG TOTAL) BY MOUTH TWICE A DAY FOR 1 DAY   Current Facility-Administered Medications on File Prior to Visit  Medication   betamethasone  acetate-betamethasone  sodium phosphate (CELESTONE ) injection 3 mg    Allergies:  No Known Allergies  Social History:  Social History   Socioeconomic History   Marital status: Married    Spouse name: Not on file   Number of children: Not on file   Years of education: Not on file   Highest education level: Not on file  Occupational History   Not on file  Tobacco Use   Smoking status: Former    Current packs/day: 0.00    Types: Cigarettes    Quit date: 04/21/2014    Years since quitting: 9.5   Smokeless tobacco: Never  Vaping Use   Vaping status: Every Day  Substance and Sexual Activity   Alcohol use: Yes    Alcohol/week: 0.0 standard drinks of alcohol    Comment: pt states every now and then   Drug use: No   Sexual activity: Yes    Birth control/protection: Other-see comments    Comment: e-sure  Other Topics Concern   Not on file  Social History Narrative   Not on file   Social Drivers of Health    Financial Resource Strain: Not on file  Food Insecurity: Not on file  Transportation Needs: Not on file  Physical Activity: Not on file  Stress: Not on file  Social Connections: Not on file  Intimate  Partner Violence: Not on file   Social History   Tobacco Use  Smoking Status Former   Current packs/day: 0.00   Types: Cigarettes   Quit date: 04/21/2014   Years since quitting: 9.5  Smokeless Tobacco Never   Social History   Substance and Sexual Activity  Alcohol Use Yes   Alcohol/week: 0.0 standard drinks of alcohol   Comment: pt states every now and then    Family History:  Family History  Problem Relation Age of Onset   Migraines Mother    Cancer Father        prostate   Heart disease Father    Lung disease Father    Autism Son    Stroke Paternal Aunt    Diabetes Paternal Uncle     Past medical history, surgical history, medications, allergies, family history and social history reviewed with patient today and changes made to appropriate areas of the chart.   ROS All other ROS negative except what is listed above and in the HPI.      Objective:    BP 100/69   Pulse 70   Temp 98 F (36.7 C) (Oral)   Ht 5' 7 (1.702 m)   Wt 113 lb 3.2 oz (51.3 kg)   LMP  (LMP Unknown)   SpO2 100%   BMI 17.73 kg/m   Wt Readings from Last 3 Encounters:  11/08/23 113 lb 3.2 oz (51.3 kg)  07/05/22 109 lb 9.6 oz (49.7 kg)  05/19/22 108 lb 3.2 oz (49.1 kg)    Physical Exam Vitals and nursing note reviewed. Exam conducted with a chaperone present.  Constitutional:      General: She is awake. She is not in acute distress.    Appearance: She is well-developed and well-groomed. She is not ill-appearing or toxic-appearing.  HENT:     Head: Normocephalic and atraumatic.     Right Ear: Hearing, tympanic membrane, ear canal and external ear normal. No drainage.     Left Ear: Hearing, tympanic membrane, ear canal and external ear normal. No drainage.     Nose: Nose normal.      Right Sinus: No maxillary sinus tenderness or frontal sinus tenderness.     Left Sinus: No maxillary sinus tenderness or frontal sinus tenderness.     Mouth/Throat:     Mouth: Mucous membranes are moist.     Pharynx: Oropharynx is clear. Uvula midline. No pharyngeal swelling, oropharyngeal exudate or posterior oropharyngeal erythema.  Eyes:     General: Lids are normal.        Right eye: No discharge.        Left eye: No discharge.     Extraocular Movements: Extraocular movements intact.     Conjunctiva/sclera: Conjunctivae normal.     Pupils: Pupils are equal, round, and reactive to light.     Visual Fields: Right eye visual fields normal and left eye visual fields normal.  Neck:     Thyroid: No thyromegaly.     Vascular: No carotid bruit.     Trachea: Trachea normal.  Cardiovascular:     Rate and Rhythm: Normal rate and regular rhythm.     Heart sounds: Normal heart sounds. No murmur heard.    No gallop.  Pulmonary:     Effort: Pulmonary effort is normal. No accessory muscle usage or respiratory distress.     Breath sounds: Normal breath sounds.  Chest:  Breasts:    Right: Normal.     Left: Normal.  Comments: Implants present. Abdominal:     General: Bowel sounds are normal.     Palpations: Abdomen is soft. There is no hepatomegaly or splenomegaly.     Tenderness: There is no abdominal tenderness.  Musculoskeletal:        General: Normal range of motion.     Cervical back: Normal range of motion and neck supple.     Right lower leg: No edema.     Left lower leg: No edema.  Lymphadenopathy:     Head:     Right side of head: No submental, submandibular, tonsillar, preauricular or posterior auricular adenopathy.     Left side of head: No submental, submandibular, tonsillar, preauricular or posterior auricular adenopathy.     Cervical: No cervical adenopathy.     Upper Body:     Right upper body: No supraclavicular, axillary or pectoral adenopathy.     Left upper body:  No supraclavicular, axillary or pectoral adenopathy.  Skin:    General: Skin is warm and dry.     Capillary Refill: Capillary refill takes less than 2 seconds.     Findings: No rash.  Neurological:     Mental Status: She is alert and oriented to person, place, and time.     Gait: Gait is intact.     Deep Tendon Reflexes: Reflexes are normal and symmetric.     Reflex Scores:      Brachioradialis reflexes are 2+ on the right side and 2+ on the left side.      Patellar reflexes are 2+ on the right side and 2+ on the left side. Psychiatric:        Attention and Perception: Attention normal.        Mood and Affect: Mood normal.        Speech: Speech normal.        Behavior: Behavior normal. Behavior is cooperative.        Thought Content: Thought content normal.        Judgment: Judgment normal.    Results for orders placed or performed in visit on 01/04/22  CBC with Differential/Platelet   Collection Time: 01/04/22 11:08 AM  Result Value Ref Range   WBC 4.2 3.4 - 10.8 x10E3/uL   RBC 4.25 3.77 - 5.28 x10E6/uL   Hemoglobin 13.3 11.1 - 15.9 g/dL   Hematocrit 60.8 65.9 - 46.6 %   MCV 92 79 - 97 fL   MCH 31.3 26.6 - 33.0 pg   MCHC 34.0 31.5 - 35.7 g/dL   RDW 87.8 88.2 - 84.5 %   Platelets 247 150 - 450 x10E3/uL   Neutrophils 64 Not Estab. %   Lymphs 28 Not Estab. %   Monocytes 6 Not Estab. %   Eos 1 Not Estab. %   Basos 1 Not Estab. %   Neutrophils Absolute 2.7 1.4 - 7.0 x10E3/uL   Lymphocytes Absolute 1.2 0.7 - 3.1 x10E3/uL   Monocytes Absolute 0.3 0.1 - 0.9 x10E3/uL   EOS (ABSOLUTE) 0.1 0.0 - 0.4 x10E3/uL   Basophils Absolute 0.0 0.0 - 0.2 x10E3/uL   Immature Granulocytes 0 Not Estab. %   Immature Grans (Abs) 0.0 0.0 - 0.1 x10E3/uL  Comprehensive metabolic panel   Collection Time: 01/04/22 11:08 AM  Result Value Ref Range   Glucose 92 70 - 99 mg/dL   BUN 8 6 - 24 mg/dL   Creatinine, Ser 9.14 0.57 - 1.00 mg/dL   eGFR 87 >40 fO/fpw/8.26   BUN/Creatinine Ratio 9 9 -  23    Sodium 140 134 - 144 mmol/L   Potassium 3.8 3.5 - 5.2 mmol/L   Chloride 103 96 - 106 mmol/L   CO2 25 20 - 29 mmol/L   Calcium 9.0 8.7 - 10.2 mg/dL   Total Protein 6.0 6.0 - 8.5 g/dL   Albumin 4.1 3.9 - 4.9 g/dL   Globulin, Total 1.9 1.5 - 4.5 g/dL   Albumin/Globulin Ratio 2.2 1.2 - 2.2   Bilirubin Total 0.4 0.0 - 1.2 mg/dL   Alkaline Phosphatase 58 44 - 121 IU/L   AST 10 0 - 40 IU/L   ALT 9 0 - 32 IU/L  Lipid Panel w/o Chol/HDL Ratio   Collection Time: 01/04/22 11:08 AM  Result Value Ref Range   Cholesterol, Total 151 100 - 199 mg/dL   Triglycerides 891 0 - 149 mg/dL   HDL 57 >60 mg/dL   VLDL Cholesterol Cal 19 5 - 40 mg/dL   LDL Chol Calc (NIH) 75 0 - 99 mg/dL  TSH   Collection Time: 01/04/22 11:08 AM  Result Value Ref Range   TSH 2.300 0.450 - 4.500 uIU/mL  VITAMIN D  25 Hydroxy (Vit-D Deficiency, Fractures)   Collection Time: 01/04/22 11:08 AM  Result Value Ref Range   Vit D, 25-Hydroxy 68.9 30.0 - 100.0 ng/mL      Assessment & Plan:   Problem List Items Addressed This Visit       Cardiovascular and Mediastinum   Migraine   Chronic, stable.  Minimal episodes.  Imitrex  refills up to date.      Relevant Medications   cyclobenzaprine  (FLEXERIL ) 10 MG tablet     Other   Vitamin D  deficiency   Low levels on past labs, check today and start supplement as needed.      Relevant Orders   VITAMIN D  25 Hydroxy (Vit-D Deficiency, Fractures)   Fibromyalgia   Chronic, ongoing.  Continue current medication regimen.  Continue collaboration with ortho and physiatry as needed.  Minimal Tramadol  use, reports no need for refill today.  Will send in Flexeril  refills.      Relevant Medications   cyclobenzaprine  (FLEXERIL ) 10 MG tablet   Other Relevant Orders   CBC with Differential/Platelet   Comprehensive metabolic panel with GFR   TSH   Bipolar I disorder (HCC) - Primary   Chronic, ongoing.  Denies SI/HI.  Continue current medication regimen and collaboration with  psychiatry.         Anxiety   Refer to depression plan of care.      Relevant Medications   hydrOXYzine (VISTARIL) 25 MG capsule   Other Visit Diagnoses       Encounter for lipid screening for cardiovascular disease       Lipid panel today.   Relevant Orders   Comprehensive metabolic panel with GFR   Lipid Panel w/o Chol/HDL Ratio     Encounter for screening mammogram for malignant neoplasm of breast       Mammogram ordered.   Relevant Orders   MM 3D DIAGNOSTIC MAMMOGRAM BILATERAL BREAST W/IMPLANT     Encounter for annual physical exam       Annual physical today, health maintenance reviewed.        Follow up plan: Return in about 1 year (around 11/07/2024) for Annual Physical.   LABORATORY TESTING:  - Pap smear: up to date  IMMUNIZATIONS:   - Tdap: Tetanus vaccination status reviewed: Td vaccination indicated and given today. - Influenza: Refused - Pneumovax: Not applicable -  Prevnar: Not applicable - COVID: Not applicable - HPV: Not applicable - Shingrix vaccine: Not applicable  SCREENING: -Mammogram: Ordered today - Colonoscopy: will think about this - Bone Density: Not applicable  -Hearing Test: Not applicable  -Spirometry: Not applicable   PATIENT COUNSELING:   Advised to take 1 mg of folate supplement per day if capable of pregnancy.   Sexuality: Discussed sexually transmitted diseases, partner selection, use of condoms, avoidance of unintended pregnancy  and contraceptive alternatives.   Advised to avoid cigarette smoking.  I discussed with the patient that most people either abstain from alcohol or drink within safe limits (<=14/week and <=4 drinks/occasion for males, <=7/weeks and <= 3 drinks/occasion for females) and that the risk for alcohol disorders and other health effects rises proportionally with the number of drinks per week and how often a drinker exceeds daily limits.  Discussed cessation/primary prevention of drug use and availability of  treatment for abuse.   Diet: Encouraged to adjust caloric intake to maintain  or achieve ideal body weight, to reduce intake of dietary saturated fat and total fat, to limit sodium intake by avoiding high sodium foods and not adding table salt, and to maintain adequate dietary potassium and calcium preferably from fresh fruits, vegetables, and low-fat dairy products.    Stressed the importance of regular exercise  Injury prevention: Discussed safety belts, safety helmets, smoke detector, smoking near bedding or upholstery.   Dental health: Discussed importance of regular tooth brushing, flossing, and dental visits.    NEXT PREVENTATIVE PHYSICAL DUE IN 1 YEAR. Return in about 1 year (around 11/07/2024) for Annual Physical.

## 2023-11-08 NOTE — Assessment & Plan Note (Signed)
 Chronic, ongoing.  Denies SI/HI.  Continue current medication regimen and collaboration with psychiatry.

## 2023-11-08 NOTE — Assessment & Plan Note (Signed)
Low levels on past labs, check today and start supplement as needed.

## 2023-11-09 ENCOUNTER — Ambulatory Visit: Payer: Self-pay | Admitting: Nurse Practitioner

## 2023-11-09 LAB — CBC WITH DIFFERENTIAL/PLATELET
Basophils Absolute: 0 x10E3/uL (ref 0.0–0.2)
Basos: 1 %
EOS (ABSOLUTE): 0.1 x10E3/uL (ref 0.0–0.4)
Eos: 2 %
Hematocrit: 44.7 % (ref 34.0–46.6)
Hemoglobin: 14.1 g/dL (ref 11.1–15.9)
Immature Grans (Abs): 0 x10E3/uL (ref 0.0–0.1)
Immature Granulocytes: 0 %
Lymphocytes Absolute: 1 x10E3/uL (ref 0.7–3.1)
Lymphs: 24 %
MCH: 30.2 pg (ref 26.6–33.0)
MCHC: 31.5 g/dL (ref 31.5–35.7)
MCV: 96 fL (ref 79–97)
Monocytes Absolute: 0.3 x10E3/uL (ref 0.1–0.9)
Monocytes: 8 %
Neutrophils Absolute: 2.6 x10E3/uL (ref 1.4–7.0)
Neutrophils: 65 %
Platelets: 242 x10E3/uL (ref 150–450)
RBC: 4.67 x10E6/uL (ref 3.77–5.28)
RDW: 12.4 % (ref 11.7–15.4)
WBC: 3.9 x10E3/uL (ref 3.4–10.8)

## 2023-11-09 LAB — COMPREHENSIVE METABOLIC PANEL WITH GFR
ALT: 14 IU/L (ref 0–32)
AST: 13 IU/L (ref 0–40)
Albumin: 4.6 g/dL (ref 3.9–4.9)
Alkaline Phosphatase: 82 IU/L (ref 41–116)
BUN/Creatinine Ratio: 20 (ref 9–23)
BUN: 16 mg/dL (ref 6–24)
Bilirubin Total: 0.4 mg/dL (ref 0.0–1.2)
CO2: 25 mmol/L (ref 20–29)
Calcium: 9.3 mg/dL (ref 8.7–10.2)
Chloride: 103 mmol/L (ref 96–106)
Creatinine, Ser: 0.81 mg/dL (ref 0.57–1.00)
Globulin, Total: 1.8 g/dL (ref 1.5–4.5)
Glucose: 60 mg/dL — ABNORMAL LOW (ref 70–99)
Potassium: 4 mmol/L (ref 3.5–5.2)
Sodium: 140 mmol/L (ref 134–144)
Total Protein: 6.4 g/dL (ref 6.0–8.5)
eGFR: 91 mL/min/1.73 (ref >59)

## 2023-11-09 LAB — LIPID PANEL W/O CHOL/HDL RATIO
Cholesterol, Total: 178 mg/dL (ref 100–199)
HDL: 94 mg/dL (ref >39)
LDL Chol Calc (NIH): 72 mg/dL (ref 0–99)
Triglycerides: 61 mg/dL (ref 0–149)
VLDL Cholesterol Cal: 12 mg/dL (ref 5–40)

## 2023-11-09 LAB — VITAMIN D 25 HYDROXY (VIT D DEFICIENCY, FRACTURES): Vit D, 25-Hydroxy: 34.9 ng/mL (ref 30.0–100.0)

## 2023-11-09 LAB — TSH: TSH: 1.33 u[IU]/mL (ref 0.450–4.500)

## 2023-11-09 NOTE — Progress Notes (Signed)
 Contacted via MyChart  Good afternoon Jacquline, your labs have returned and look fantastic.  No changes needed.  Great job!! Keep being amazing!!  Thank you for allowing me to participate in your care.  I appreciate you. Kindest regards, Brexlee Heberlein

## 2024-11-10 ENCOUNTER — Encounter: Admitting: Nurse Practitioner
